# Patient Record
Sex: Male | Born: 1975
Health system: Southern US, Community
[De-identification: ages and names within clinical notes are randomized; demographics above are authoritative.]

## PROBLEM LIST (undated history)

## (undated) DIAGNOSIS — Q909 Down syndrome, unspecified: Secondary | ICD-10-CM

## (undated) DIAGNOSIS — I10 Essential (primary) hypertension: Secondary | ICD-10-CM

---

## 1999-05-19 ENCOUNTER — Emergency Department (HOSPITAL_COMMUNITY): Admission: EM | Admit: 1999-05-19 | Discharge: 1999-05-19 | Payer: Self-pay | Admitting: Emergency Medicine

## 1999-05-19 ENCOUNTER — Encounter: Payer: Self-pay | Admitting: Emergency Medicine

## 2004-07-10 ENCOUNTER — Ambulatory Visit (HOSPITAL_COMMUNITY): Admission: RE | Admit: 2004-07-10 | Discharge: 2004-07-10 | Payer: Self-pay | Admitting: Internal Medicine

## 2007-04-01 ENCOUNTER — Ambulatory Visit (HOSPITAL_COMMUNITY): Admission: RE | Admit: 2007-04-01 | Discharge: 2007-04-01 | Payer: Self-pay | Admitting: Internal Medicine

## 2009-02-02 ENCOUNTER — Ambulatory Visit: Payer: Self-pay | Admitting: Radiology

## 2009-02-02 ENCOUNTER — Emergency Department (HOSPITAL_BASED_OUTPATIENT_CLINIC_OR_DEPARTMENT_OTHER): Admission: EM | Admit: 2009-02-02 | Discharge: 2009-02-02 | Payer: Self-pay | Admitting: Emergency Medicine

## 2009-03-07 ENCOUNTER — Encounter: Admission: RE | Admit: 2009-03-07 | Discharge: 2009-03-07 | Payer: Self-pay | Admitting: Orthopedic Surgery

## 2009-04-03 ENCOUNTER — Encounter: Admission: RE | Admit: 2009-04-03 | Discharge: 2009-04-24 | Payer: Self-pay | Admitting: Orthopedic Surgery

## 2009-08-29 ENCOUNTER — Encounter: Admission: RE | Admit: 2009-08-29 | Discharge: 2009-08-29 | Payer: Self-pay | Admitting: Specialist

## 2010-06-03 ENCOUNTER — Emergency Department (HOSPITAL_BASED_OUTPATIENT_CLINIC_OR_DEPARTMENT_OTHER)
Admission: EM | Admit: 2010-06-03 | Discharge: 2010-06-03 | Payer: Self-pay | Source: Home / Self Care | Admitting: Emergency Medicine

## 2010-06-04 LAB — GLUCOSE, CAPILLARY: Glucose-Capillary: 150 mg/dL — ABNORMAL HIGH (ref 70–99)

## 2011-05-14 DIAGNOSIS — I10 Essential (primary) hypertension: Secondary | ICD-10-CM | POA: Diagnosis not present

## 2011-05-14 DIAGNOSIS — E119 Type 2 diabetes mellitus without complications: Secondary | ICD-10-CM | POA: Diagnosis not present

## 2011-05-14 DIAGNOSIS — F79 Unspecified intellectual disabilities: Secondary | ICD-10-CM | POA: Diagnosis not present

## 2011-06-12 DIAGNOSIS — Z79899 Other long term (current) drug therapy: Secondary | ICD-10-CM | POA: Diagnosis not present

## 2011-06-16 ENCOUNTER — Encounter (HOSPITAL_BASED_OUTPATIENT_CLINIC_OR_DEPARTMENT_OTHER): Payer: Self-pay | Admitting: Emergency Medicine

## 2011-06-16 ENCOUNTER — Emergency Department (HOSPITAL_BASED_OUTPATIENT_CLINIC_OR_DEPARTMENT_OTHER)
Admission: EM | Admit: 2011-06-16 | Discharge: 2011-06-17 | Disposition: A | Discharge status: Home / Self Care | Payer: Medicaid Other | Attending: Emergency Medicine | Admitting: Emergency Medicine

## 2011-06-16 DIAGNOSIS — Q909 Down syndrome, unspecified: Secondary | ICD-10-CM | POA: Insufficient documentation

## 2011-06-16 DIAGNOSIS — E1169 Type 2 diabetes mellitus with other specified complication: Secondary | ICD-10-CM | POA: Diagnosis not present

## 2011-06-16 DIAGNOSIS — E162 Hypoglycemia, unspecified: Secondary | ICD-10-CM

## 2011-06-16 DIAGNOSIS — I10 Essential (primary) hypertension: Secondary | ICD-10-CM | POA: Insufficient documentation

## 2011-06-16 HISTORY — DX: Essential (primary) hypertension: I10

## 2011-06-16 HISTORY — DX: Down syndrome, unspecified: Q90.9

## 2011-06-16 LAB — URINALYSIS, ROUTINE W REFLEX MICROSCOPIC
Bilirubin Urine: NEGATIVE
Leukocytes, UA: NEGATIVE
Nitrite: NEGATIVE
Specific Gravity, Urine: 1.024 (ref 1.005–1.030)
Urobilinogen, UA: 1 mg/dL (ref 0.0–1.0)
pH: 8 (ref 5.0–8.0)

## 2011-06-16 LAB — GLUCOSE, CAPILLARY: Glucose-Capillary: 90 mg/dL (ref 70–99)

## 2011-06-16 MED ORDER — ONDANSETRON 4 MG PO TBDP
4.0000 mg | ORAL_TABLET | Freq: Once | ORAL | Status: AC
  Administered 2011-06-16: 4 mg via ORAL
  Filled 2011-06-16: qty 1

## 2011-06-16 NOTE — ED Notes (Signed)
Pt has had low blood sugar tonight. Pt lives in a group home. Pt drank juice and ate peanut butter.

## 2011-06-16 NOTE — ED Provider Notes (Signed)
History     CSN: [id number]  Arrival date & time 06/16/11  2253   First MD Initiated Contact with Patient 06/16/11 2325      Chief Complaint  Patient presents with  . Hypoglycemia    (Consider location/radiation/quality/duration/timing/severity/associated sxs/prior treatment) HPI This is a 36 year old black male with history of Down syndrome. He also has a history of diabetes. His sugar was checked at 5 PM per protocol and found to be 48. Despite eating dinner it continued to be below 60 for several hours, as well as 43 at one time. He drank some juice and ate peanut butter but because of persistent hypoglycemia he was brought here. His blood sugar was noted to be 90 on arrival. He denies being symptomatic at any time, specifically denying nausea, vomiting, fever, chills or dysuria. All of having a diagnosis of Down syndrome on examination he appears to be very high functioning and able to give a reasonable history and review of systems.  Past Medical History  Diagnosis Date  . Diabetes mellitus   . Down syndrome   . Hypertension     History reviewed. No pertinent past surgical history.  No family history on file.  History  Substance Use Topics  . Smoking status: Never Smoker   . Smokeless tobacco: Not on file  . Alcohol Use: No      Review of Systems  All other systems reviewed and are negative.    Allergies  Review of patient's allergies indicates no known allergies.  Home Medications   Current Outpatient Rx  Name Route Sig Dispense Refill  . AMLODIPINE BESYLATE 10 MG PO TABS Oral Take 10 mg by mouth daily.    . ENALAPRIL MALEATE 20 MG PO TABS Oral Take 20 mg by mouth daily.    Marland Kitchen HYDROCHLOROTHIAZIDE 25 MG PO TABS Oral Take 25 mg by mouth daily.    . INSULIN LISPRO PROT & LISPRO (75-25) 100 UNIT/ML [place] SUSP Subcutaneous Inject 25 Units into the skin daily with supper.    Marland Kitchen METFORMIN HCL 500 MG PO TABS Oral Take 500 mg by mouth 2 (two) times daily with a meal.      . NALTREXONE HCL 50 MG PO TABS Oral Take 50 mg by mouth daily.    Marland Kitchen VITAMIN D (ERGOCALCIFEROL) 50000 UNITS PO CAPS Oral Take 50,000 Units by mouth every 30 (thirty) days. Take on the 14th      BP 150/87  Pulse 98  Temp(Src) 98.3 F (36.8 C) (Oral)  Resp 18  SpO2 95%  Physical Exam General: Well-developed, well-nourished male in no acute distress; appearance consistent with age of record HENT: Slightly dysmorphic facies but without the usual appearance of Down syndrome, atraumatic Eyes: pupils equal round and reactive to light; extraocular muscles intact; no Brushfield spots Neck: supple Heart: regular rate and rhythm; no murmurs Lungs: clear to auscultation bilaterally Abdomen: soft; nondistended Extremities: No deformity; full range of motion; pulses normal; mild clinodactyly; double palmar creases Neurologic: Awake, alert and oriented; motor function intact in all extremities and symmetric; no facial droop Skin: Warm and dry     ED Course  Procedures (including critical care time)     MDM   Nursing notes and vitals signs, including pulse oximetry, reviewed.  Summary of this visit's results, reviewed by myself:  Labs:  Results for orders placed during the hospital encounter of 06/16/11  GLUCOSE, CAPILLARY      Component Value Range   Glucose-Capillary 90  70 - 99 (mg/dL)  URINALYSIS, ROUTINE W REFLEX MICROSCOPIC      Component Value Range   Color, Urine YELLOW  YELLOW    APPearance CLEAR  CLEAR    Specific Gravity, Urine 1.024  1.005 - 1.030    pH 8.0  5.0 - 8.0    Glucose, UA NEGATIVE  NEGATIVE (mg/dL)   Hgb urine dipstick NEGATIVE  NEGATIVE    Bilirubin Urine NEGATIVE  NEGATIVE    Ketones, ur 40 (*) NEGATIVE (mg/dL)   Protein, ur NEGATIVE  NEGATIVE (mg/dL)   Urobilinogen, UA 1.0  0.0 - 1.0 (mg/dL)   Nitrite NEGATIVE  NEGATIVE    Leukocytes, UA NEGATIVE  NEGATIVE   GLUCOSE, CAPILLARY      Component Value Range   Glucose-Capillary 153 (*) 70 - 99 (mg/dL)              Hanley Seamen, MD 06/17/11 0100

## 2011-09-08 DIAGNOSIS — I10 Essential (primary) hypertension: Secondary | ICD-10-CM | POA: Diagnosis not present

## 2011-09-08 DIAGNOSIS — E119 Type 2 diabetes mellitus without complications: Secondary | ICD-10-CM | POA: Diagnosis not present

## 2011-09-08 DIAGNOSIS — F79 Unspecified intellectual disabilities: Secondary | ICD-10-CM | POA: Diagnosis not present

## 2011-11-09 DIAGNOSIS — F79 Unspecified intellectual disabilities: Secondary | ICD-10-CM | POA: Diagnosis not present

## 2011-11-09 DIAGNOSIS — E109 Type 1 diabetes mellitus without complications: Secondary | ICD-10-CM | POA: Diagnosis not present

## 2011-11-09 DIAGNOSIS — I10 Essential (primary) hypertension: Secondary | ICD-10-CM | POA: Diagnosis not present

## 2011-11-11 DIAGNOSIS — I10 Essential (primary) hypertension: Secondary | ICD-10-CM | POA: Diagnosis not present

## 2011-11-11 DIAGNOSIS — F79 Unspecified intellectual disabilities: Secondary | ICD-10-CM | POA: Diagnosis not present

## 2011-11-11 DIAGNOSIS — T148XXA Other injury of unspecified body region, initial encounter: Secondary | ICD-10-CM | POA: Diagnosis not present

## 2011-11-11 DIAGNOSIS — E109 Type 1 diabetes mellitus without complications: Secondary | ICD-10-CM | POA: Diagnosis not present

## 2011-11-16 DIAGNOSIS — B351 Tinea unguium: Secondary | ICD-10-CM | POA: Diagnosis not present

## 2011-11-17 DIAGNOSIS — I10 Essential (primary) hypertension: Secondary | ICD-10-CM | POA: Diagnosis not present

## 2011-11-17 DIAGNOSIS — E049 Nontoxic goiter, unspecified: Secondary | ICD-10-CM | POA: Diagnosis not present

## 2011-11-17 DIAGNOSIS — E559 Vitamin D deficiency, unspecified: Secondary | ICD-10-CM | POA: Diagnosis not present

## 2011-11-17 DIAGNOSIS — E1101 Type 2 diabetes mellitus with hyperosmolarity with coma: Secondary | ICD-10-CM | POA: Diagnosis not present

## 2011-11-17 DIAGNOSIS — R946 Abnormal results of thyroid function studies: Secondary | ICD-10-CM | POA: Diagnosis not present

## 2011-11-17 DIAGNOSIS — L03119 Cellulitis of unspecified part of limb: Secondary | ICD-10-CM | POA: Diagnosis not present

## 2011-12-01 ENCOUNTER — Other Ambulatory Visit: Payer: Self-pay | Admitting: Internal Medicine

## 2011-12-01 DIAGNOSIS — R7989 Other specified abnormal findings of blood chemistry: Secondary | ICD-10-CM

## 2011-12-02 DIAGNOSIS — E109 Type 1 diabetes mellitus without complications: Secondary | ICD-10-CM | POA: Diagnosis not present

## 2011-12-08 ENCOUNTER — Encounter (HOSPITAL_COMMUNITY)
Admission: RE | Admit: 2011-12-08 | Discharge: 2011-12-08 | Disposition: A | Discharge status: Home / Self Care | Payer: Medicare Other | Source: Ambulatory Visit | Attending: Internal Medicine | Admitting: Internal Medicine

## 2011-12-08 DIAGNOSIS — R7989 Other specified abnormal findings of blood chemistry: Secondary | ICD-10-CM

## 2011-12-08 DIAGNOSIS — E0590 Thyrotoxicosis, unspecified without thyrotoxic crisis or storm: Secondary | ICD-10-CM | POA: Diagnosis not present

## 2011-12-08 DIAGNOSIS — R946 Abnormal results of thyroid function studies: Secondary | ICD-10-CM | POA: Insufficient documentation

## 2011-12-08 MED ORDER — SODIUM IODIDE I 131 CAPSULE
7.0000 | Freq: Once | INTRAVENOUS | Status: AC | PRN
  Administered 2011-12-08: 7 via ORAL

## 2011-12-09 ENCOUNTER — Encounter (HOSPITAL_COMMUNITY)
Admission: RE | Admit: 2011-12-09 | Discharge: 2011-12-09 | Disposition: A | Discharge status: Home / Self Care | Payer: Medicare Other | Source: Ambulatory Visit | Attending: Internal Medicine | Admitting: Internal Medicine

## 2011-12-09 DIAGNOSIS — R946 Abnormal results of thyroid function studies: Secondary | ICD-10-CM | POA: Diagnosis not present

## 2011-12-09 MED ORDER — SODIUM IODIDE I 131 CAPSULE
7.0000 | Freq: Once | INTRAVENOUS | Status: AC | PRN
  Administered 2011-12-08: 7 via ORAL

## 2011-12-09 MED ORDER — SODIUM IODIDE I 131 CAPSULE
7.0000 | Freq: Once | INTRAVENOUS | Status: AC | PRN
  Administered 2011-12-09: 7 via ORAL

## 2011-12-09 MED ORDER — SODIUM PERTECHNETATE TC 99M INJECTION
10.0000 | Freq: Once | INTRAVENOUS | Status: AC | PRN
  Administered 2011-12-09: 10 via INTRAVENOUS

## 2011-12-16 DIAGNOSIS — R718 Other abnormality of red blood cells: Secondary | ICD-10-CM | POA: Diagnosis not present

## 2011-12-16 DIAGNOSIS — E119 Type 2 diabetes mellitus without complications: Secondary | ICD-10-CM | POA: Diagnosis not present

## 2011-12-16 DIAGNOSIS — I10 Essential (primary) hypertension: Secondary | ICD-10-CM | POA: Diagnosis not present

## 2011-12-16 DIAGNOSIS — F79 Unspecified intellectual disabilities: Secondary | ICD-10-CM | POA: Diagnosis not present

## 2011-12-23 DIAGNOSIS — E061 Subacute thyroiditis: Secondary | ICD-10-CM | POA: Diagnosis not present

## 2011-12-29 DIAGNOSIS — E119 Type 2 diabetes mellitus without complications: Secondary | ICD-10-CM | POA: Diagnosis not present

## 2011-12-29 DIAGNOSIS — E785 Hyperlipidemia, unspecified: Secondary | ICD-10-CM | POA: Diagnosis not present

## 2011-12-29 DIAGNOSIS — T887XXA Unspecified adverse effect of drug or medicament, initial encounter: Secondary | ICD-10-CM | POA: Diagnosis not present

## 2012-02-01 IMAGING — CR DG CHEST 2V
2 series · 2 of 2 positions shown · non-contrast
Comparison: None.

CLINICAL DATA: 34-year-old with cough and fever.

CHEST - 2 VIEW

[w chest pa]
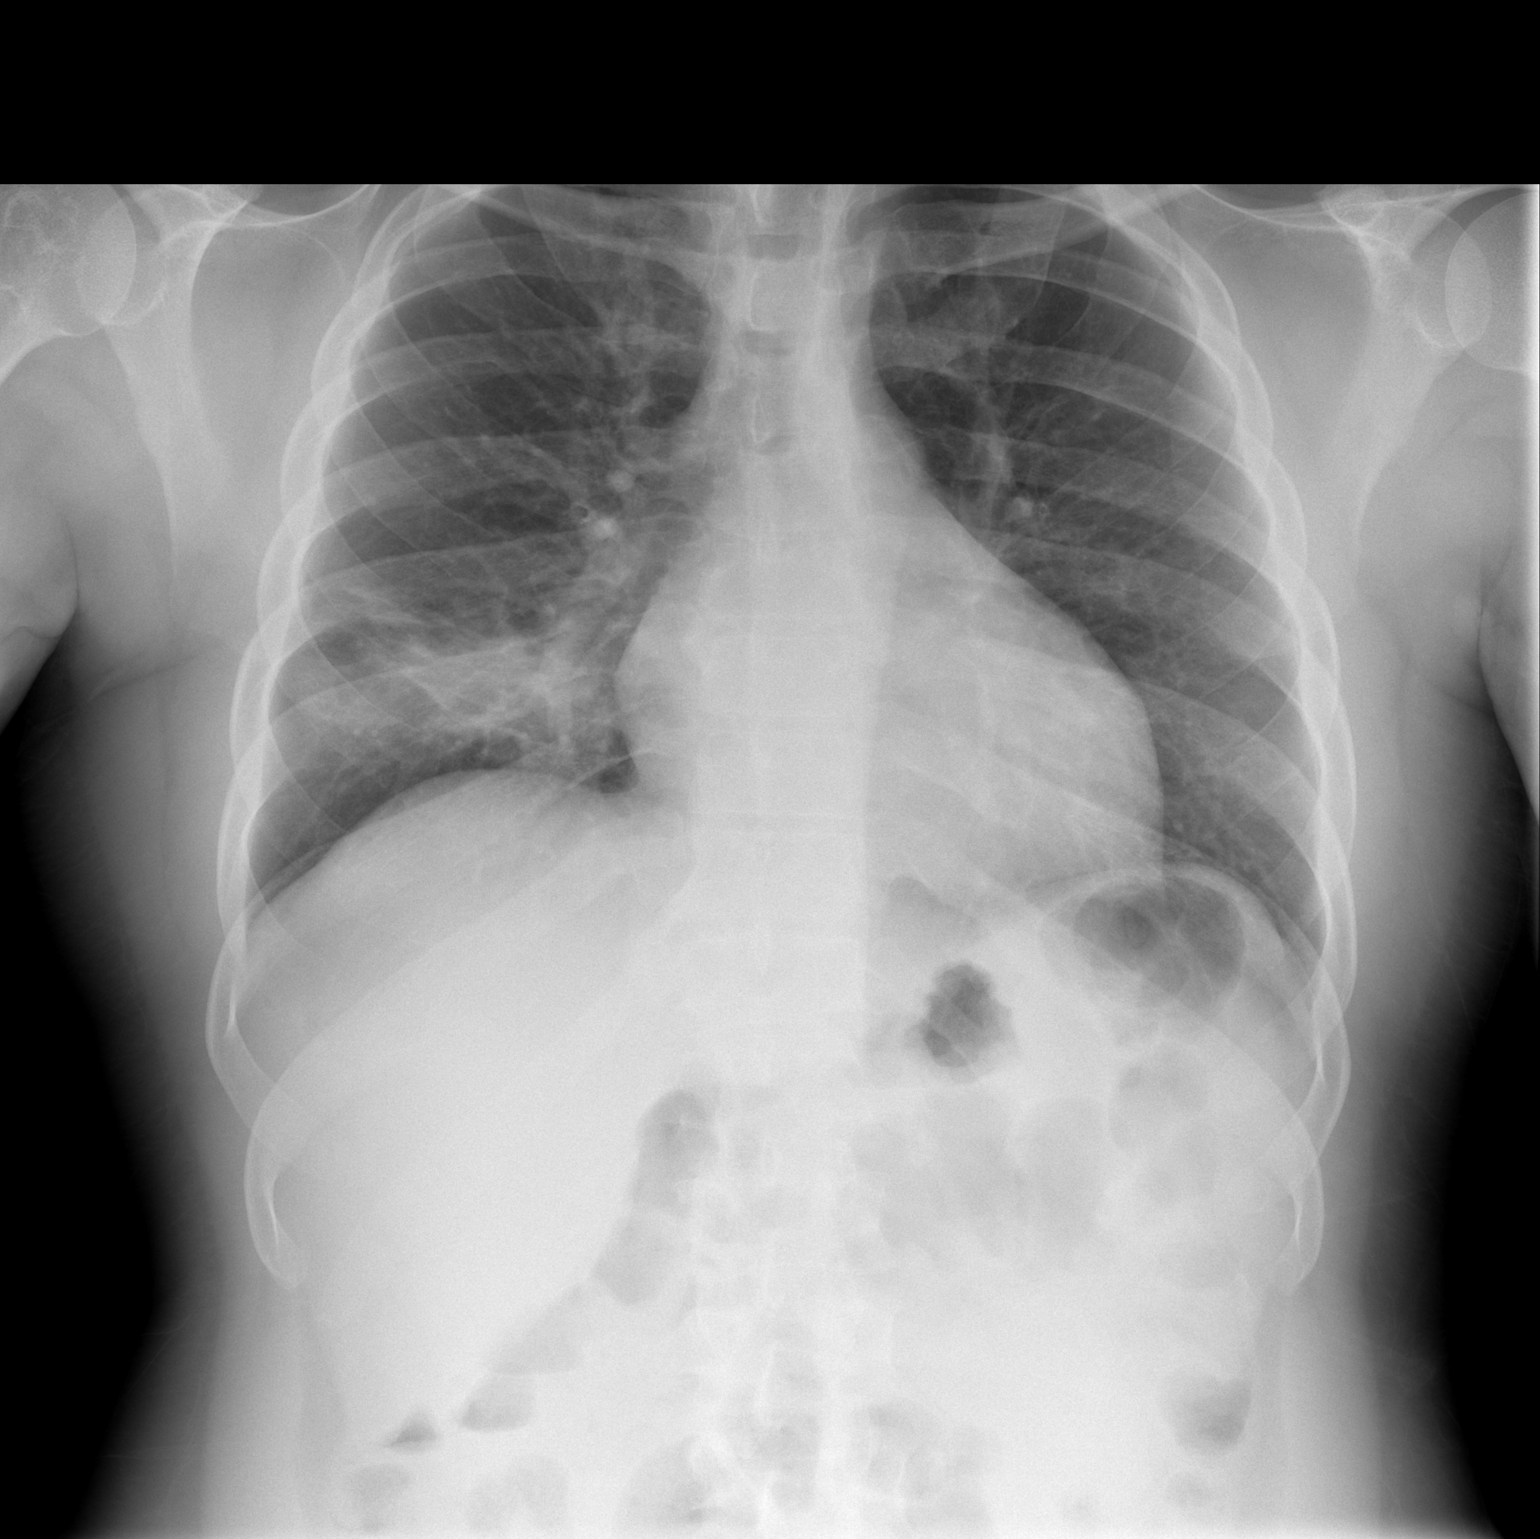

[w chest lat]
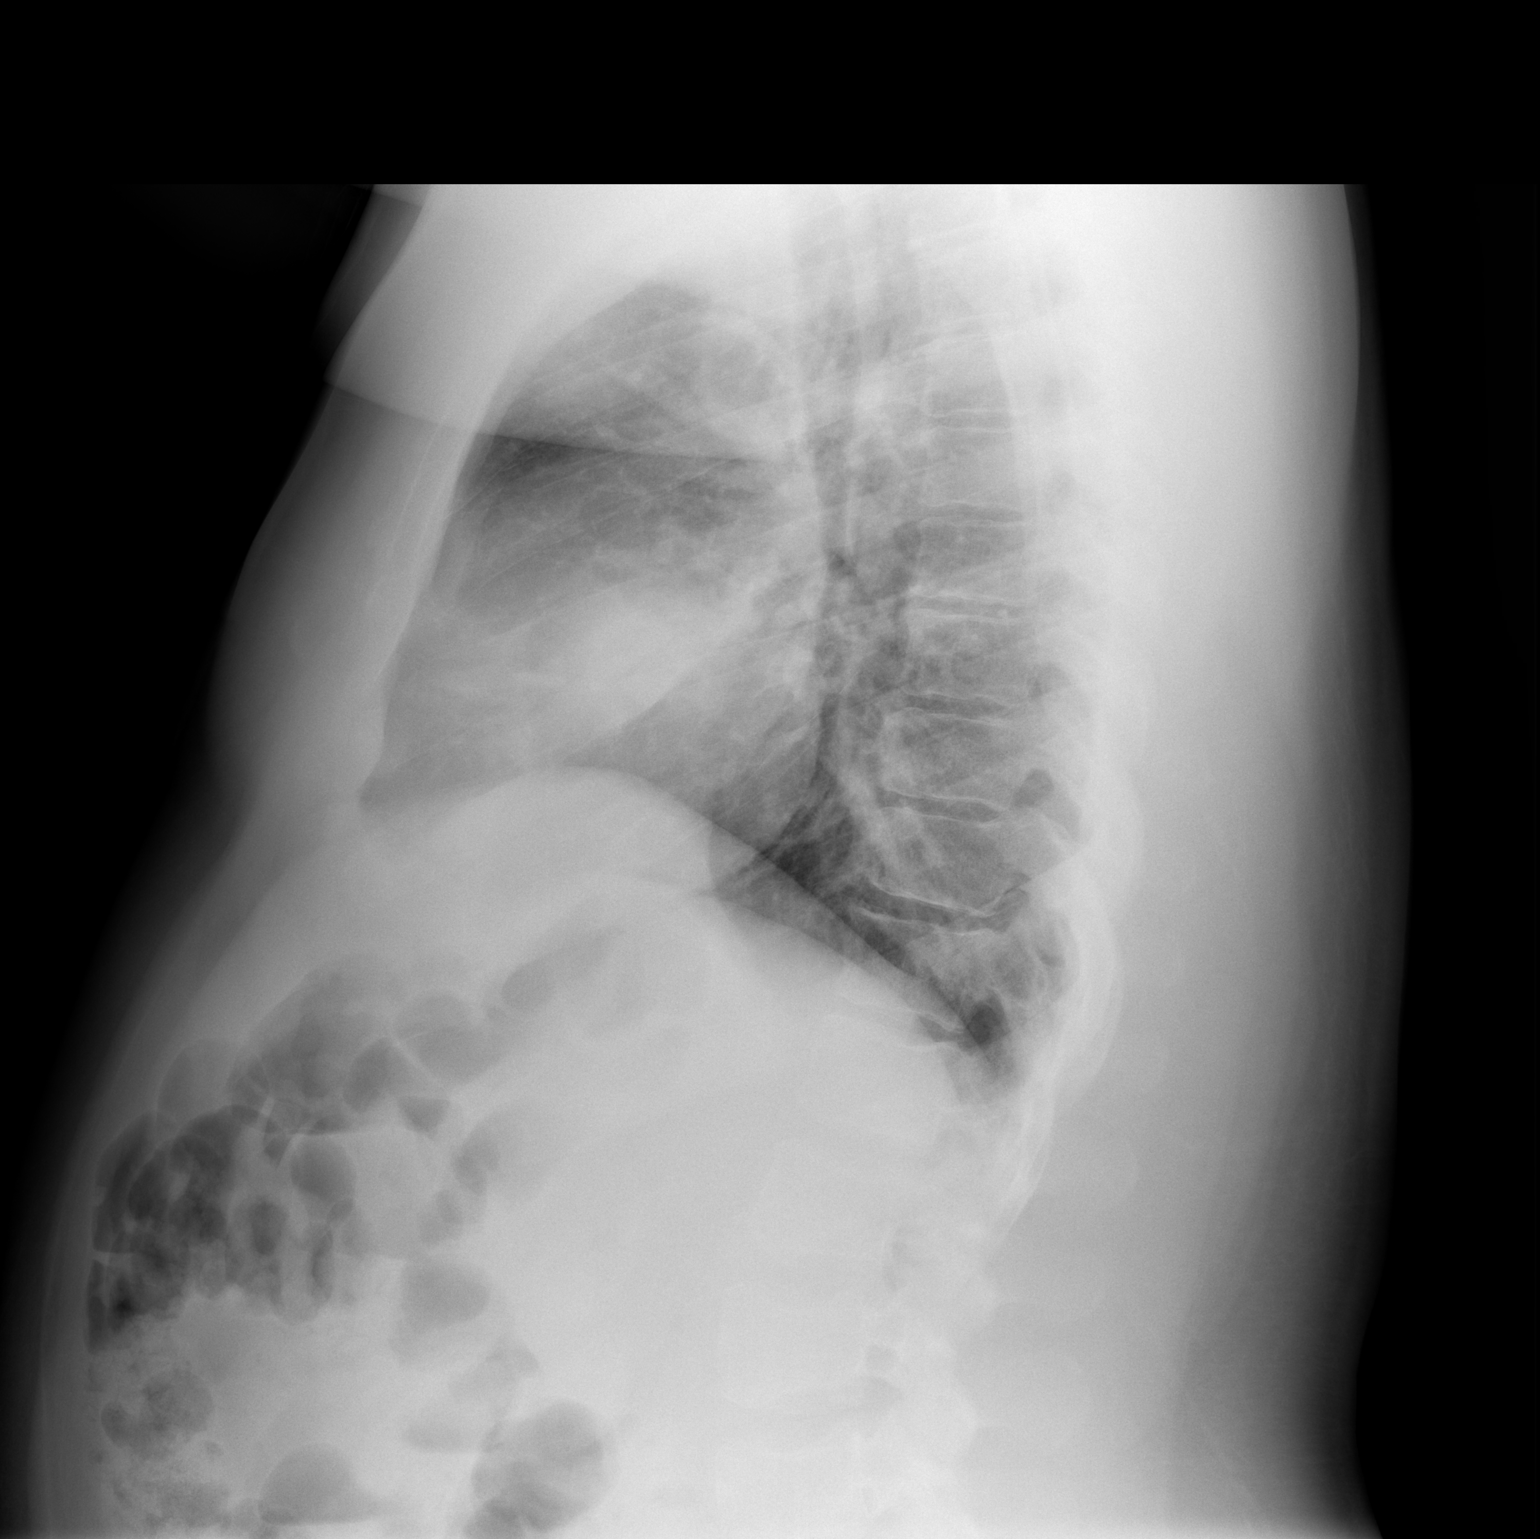

[2 of 2 positions shown; findings below may reference images not displayed]

FINDINGS: Two views of the chest demonstrate opacification in the
region of the right middle lobe.  Findings are most compatible with
infection.  Remainder of the lungs appear clear.  Heart and
mediastinum are within normal limits and the trachea is midline.
Osseous structures are intact.
IMPRESSION: Right middle lobe pneumonia.

Report was called to Dr. Nasuhi on 06/03/2010 at [DATE] p.m.

## 2012-02-09 DIAGNOSIS — I10 Essential (primary) hypertension: Secondary | ICD-10-CM | POA: Diagnosis not present

## 2012-02-09 DIAGNOSIS — E061 Subacute thyroiditis: Secondary | ICD-10-CM | POA: Diagnosis not present

## 2012-02-09 DIAGNOSIS — E7800 Pure hypercholesterolemia, unspecified: Secondary | ICD-10-CM | POA: Diagnosis not present

## 2012-03-02 DIAGNOSIS — F489 Nonpsychotic mental disorder, unspecified: Secondary | ICD-10-CM | POA: Diagnosis not present

## 2012-03-02 DIAGNOSIS — E119 Type 2 diabetes mellitus without complications: Secondary | ICD-10-CM | POA: Diagnosis not present

## 2012-03-02 DIAGNOSIS — F4325 Adjustment disorder with mixed disturbance of emotions and conduct: Secondary | ICD-10-CM | POA: Diagnosis not present

## 2012-03-02 DIAGNOSIS — I10 Essential (primary) hypertension: Secondary | ICD-10-CM | POA: Diagnosis not present

## 2012-03-25 DIAGNOSIS — T887XXA Unspecified adverse effect of drug or medicament, initial encounter: Secondary | ICD-10-CM | POA: Diagnosis not present

## 2012-03-25 DIAGNOSIS — E618 Deficiency of other specified nutrient elements: Secondary | ICD-10-CM | POA: Diagnosis not present

## 2012-04-04 DIAGNOSIS — Z23 Encounter for immunization: Secondary | ICD-10-CM | POA: Diagnosis not present

## 2012-04-12 DIAGNOSIS — B351 Tinea unguium: Secondary | ICD-10-CM | POA: Diagnosis not present

## 2012-04-22 DIAGNOSIS — Z79899 Other long term (current) drug therapy: Secondary | ICD-10-CM | POA: Diagnosis not present

## 2012-04-22 DIAGNOSIS — F79 Unspecified intellectual disabilities: Secondary | ICD-10-CM | POA: Diagnosis not present

## 2012-05-24 DIAGNOSIS — I10 Essential (primary) hypertension: Secondary | ICD-10-CM | POA: Diagnosis not present

## 2012-05-24 DIAGNOSIS — E061 Subacute thyroiditis: Secondary | ICD-10-CM | POA: Diagnosis not present

## 2012-06-16 DIAGNOSIS — E559 Vitamin D deficiency, unspecified: Secondary | ICD-10-CM | POA: Diagnosis not present

## 2012-06-16 DIAGNOSIS — E785 Hyperlipidemia, unspecified: Secondary | ICD-10-CM | POA: Diagnosis not present

## 2012-06-16 DIAGNOSIS — T887XXA Unspecified adverse effect of drug or medicament, initial encounter: Secondary | ICD-10-CM | POA: Diagnosis not present

## 2012-06-16 DIAGNOSIS — E119 Type 2 diabetes mellitus without complications: Secondary | ICD-10-CM | POA: Diagnosis not present

## 2012-07-05 DIAGNOSIS — I10 Essential (primary) hypertension: Secondary | ICD-10-CM | POA: Diagnosis not present

## 2012-08-19 DIAGNOSIS — E785 Hyperlipidemia, unspecified: Secondary | ICD-10-CM | POA: Diagnosis not present

## 2012-08-19 DIAGNOSIS — T887XXA Unspecified adverse effect of drug or medicament, initial encounter: Secondary | ICD-10-CM | POA: Diagnosis not present

## 2012-09-13 DIAGNOSIS — Z79899 Other long term (current) drug therapy: Secondary | ICD-10-CM | POA: Diagnosis not present

## 2012-09-13 DIAGNOSIS — F79 Unspecified intellectual disabilities: Secondary | ICD-10-CM | POA: Diagnosis not present

## 2012-09-22 DIAGNOSIS — T887XXA Unspecified adverse effect of drug or medicament, initial encounter: Secondary | ICD-10-CM | POA: Diagnosis not present

## 2012-11-01 DIAGNOSIS — I10 Essential (primary) hypertension: Secondary | ICD-10-CM | POA: Diagnosis not present

## 2012-11-01 DIAGNOSIS — E782 Mixed hyperlipidemia: Secondary | ICD-10-CM | POA: Diagnosis not present

## 2012-11-02 DIAGNOSIS — F79 Unspecified intellectual disabilities: Secondary | ICD-10-CM | POA: Diagnosis not present

## 2012-11-02 DIAGNOSIS — E119 Type 2 diabetes mellitus without complications: Secondary | ICD-10-CM | POA: Diagnosis not present

## 2012-11-02 DIAGNOSIS — E663 Overweight: Secondary | ICD-10-CM | POA: Diagnosis not present

## 2012-11-23 DIAGNOSIS — R718 Other abnormality of red blood cells: Secondary | ICD-10-CM | POA: Diagnosis not present

## 2012-11-23 DIAGNOSIS — E119 Type 2 diabetes mellitus without complications: Secondary | ICD-10-CM | POA: Diagnosis not present

## 2012-11-23 DIAGNOSIS — F79 Unspecified intellectual disabilities: Secondary | ICD-10-CM | POA: Diagnosis not present

## 2012-12-07 DIAGNOSIS — E119 Type 2 diabetes mellitus without complications: Secondary | ICD-10-CM | POA: Diagnosis not present

## 2012-12-07 DIAGNOSIS — R635 Abnormal weight gain: Secondary | ICD-10-CM | POA: Diagnosis not present

## 2012-12-07 DIAGNOSIS — F79 Unspecified intellectual disabilities: Secondary | ICD-10-CM | POA: Diagnosis not present

## 2012-12-12 DIAGNOSIS — E785 Hyperlipidemia, unspecified: Secondary | ICD-10-CM | POA: Diagnosis not present

## 2012-12-12 DIAGNOSIS — T887XXA Unspecified adverse effect of drug or medicament, initial encounter: Secondary | ICD-10-CM | POA: Diagnosis not present

## 2012-12-12 DIAGNOSIS — Z7901 Long term (current) use of anticoagulants: Secondary | ICD-10-CM | POA: Diagnosis not present

## 2012-12-12 DIAGNOSIS — E559 Vitamin D deficiency, unspecified: Secondary | ICD-10-CM | POA: Diagnosis not present

## 2012-12-12 DIAGNOSIS — E119 Type 2 diabetes mellitus without complications: Secondary | ICD-10-CM | POA: Diagnosis not present

## 2012-12-28 DIAGNOSIS — E119 Type 2 diabetes mellitus without complications: Secondary | ICD-10-CM | POA: Diagnosis not present

## 2012-12-28 DIAGNOSIS — F79 Unspecified intellectual disabilities: Secondary | ICD-10-CM | POA: Diagnosis not present

## 2012-12-28 DIAGNOSIS — R718 Other abnormality of red blood cells: Secondary | ICD-10-CM | POA: Diagnosis not present

## 2013-01-11 DIAGNOSIS — F79 Unspecified intellectual disabilities: Secondary | ICD-10-CM | POA: Diagnosis not present

## 2013-01-11 DIAGNOSIS — E119 Type 2 diabetes mellitus without complications: Secondary | ICD-10-CM | POA: Diagnosis not present

## 2013-01-11 DIAGNOSIS — I10 Essential (primary) hypertension: Secondary | ICD-10-CM | POA: Diagnosis not present

## 2013-01-18 DIAGNOSIS — E1065 Type 1 diabetes mellitus with hyperglycemia: Secondary | ICD-10-CM | POA: Diagnosis not present

## 2013-02-15 DIAGNOSIS — F79 Unspecified intellectual disabilities: Secondary | ICD-10-CM | POA: Diagnosis not present

## 2013-02-15 DIAGNOSIS — E119 Type 2 diabetes mellitus without complications: Secondary | ICD-10-CM | POA: Diagnosis not present

## 2013-02-15 DIAGNOSIS — H6120 Impacted cerumen, unspecified ear: Secondary | ICD-10-CM | POA: Diagnosis not present

## 2013-02-15 DIAGNOSIS — I10 Essential (primary) hypertension: Secondary | ICD-10-CM | POA: Diagnosis not present

## 2013-02-21 DIAGNOSIS — H6120 Impacted cerumen, unspecified ear: Secondary | ICD-10-CM | POA: Diagnosis not present

## 2013-02-21 DIAGNOSIS — F79 Unspecified intellectual disabilities: Secondary | ICD-10-CM | POA: Diagnosis not present

## 2013-02-21 DIAGNOSIS — K5900 Constipation, unspecified: Secondary | ICD-10-CM | POA: Diagnosis not present

## 2013-03-06 DIAGNOSIS — I10 Essential (primary) hypertension: Secondary | ICD-10-CM | POA: Diagnosis not present

## 2013-03-06 DIAGNOSIS — M2550 Pain in unspecified joint: Secondary | ICD-10-CM | POA: Diagnosis not present

## 2013-03-06 DIAGNOSIS — E7800 Pure hypercholesterolemia, unspecified: Secondary | ICD-10-CM | POA: Diagnosis not present

## 2013-03-06 DIAGNOSIS — E669 Obesity, unspecified: Secondary | ICD-10-CM | POA: Diagnosis not present

## 2013-03-08 DIAGNOSIS — E669 Obesity, unspecified: Secondary | ICD-10-CM | POA: Diagnosis not present

## 2013-03-08 DIAGNOSIS — E119 Type 2 diabetes mellitus without complications: Secondary | ICD-10-CM | POA: Diagnosis not present

## 2013-03-08 DIAGNOSIS — F79 Unspecified intellectual disabilities: Secondary | ICD-10-CM | POA: Diagnosis not present

## 2013-03-08 DIAGNOSIS — E785 Hyperlipidemia, unspecified: Secondary | ICD-10-CM | POA: Diagnosis not present

## 2013-03-09 DIAGNOSIS — Z79899 Other long term (current) drug therapy: Secondary | ICD-10-CM | POA: Diagnosis not present

## 2013-03-09 DIAGNOSIS — I10 Essential (primary) hypertension: Secondary | ICD-10-CM | POA: Diagnosis not present

## 2013-03-09 DIAGNOSIS — F79 Unspecified intellectual disabilities: Secondary | ICD-10-CM | POA: Diagnosis not present

## 2013-03-15 DIAGNOSIS — E119 Type 2 diabetes mellitus without complications: Secondary | ICD-10-CM | POA: Diagnosis not present

## 2013-03-15 DIAGNOSIS — I10 Essential (primary) hypertension: Secondary | ICD-10-CM | POA: Diagnosis not present

## 2013-03-15 DIAGNOSIS — F79 Unspecified intellectual disabilities: Secondary | ICD-10-CM | POA: Diagnosis not present

## 2013-04-04 DIAGNOSIS — E119 Type 2 diabetes mellitus without complications: Secondary | ICD-10-CM | POA: Diagnosis not present

## 2013-04-04 DIAGNOSIS — E249 Cushing's syndrome, unspecified: Secondary | ICD-10-CM | POA: Diagnosis not present

## 2013-04-04 DIAGNOSIS — T887XXA Unspecified adverse effect of drug or medicament, initial encounter: Secondary | ICD-10-CM | POA: Diagnosis not present

## 2013-04-12 DIAGNOSIS — F79 Unspecified intellectual disabilities: Secondary | ICD-10-CM | POA: Diagnosis not present

## 2013-04-12 DIAGNOSIS — E119 Type 2 diabetes mellitus without complications: Secondary | ICD-10-CM | POA: Diagnosis not present

## 2013-04-13 DIAGNOSIS — Z23 Encounter for immunization: Secondary | ICD-10-CM | POA: Diagnosis not present

## 2013-05-17 DIAGNOSIS — F79 Unspecified intellectual disabilities: Secondary | ICD-10-CM | POA: Diagnosis not present

## 2013-05-17 DIAGNOSIS — E119 Type 2 diabetes mellitus without complications: Secondary | ICD-10-CM | POA: Diagnosis not present

## 2013-05-17 DIAGNOSIS — H6120 Impacted cerumen, unspecified ear: Secondary | ICD-10-CM | POA: Diagnosis not present

## 2013-05-30 DIAGNOSIS — R1012 Left upper quadrant pain: Secondary | ICD-10-CM | POA: Diagnosis not present

## 2013-05-30 DIAGNOSIS — I10 Essential (primary) hypertension: Secondary | ICD-10-CM | POA: Diagnosis not present

## 2013-05-30 DIAGNOSIS — IMO0001 Reserved for inherently not codable concepts without codable children: Secondary | ICD-10-CM | POA: Diagnosis not present

## 2013-05-30 DIAGNOSIS — E7800 Pure hypercholesterolemia, unspecified: Secondary | ICD-10-CM | POA: Diagnosis not present

## 2013-05-31 DIAGNOSIS — E119 Type 2 diabetes mellitus without complications: Secondary | ICD-10-CM | POA: Diagnosis not present

## 2013-05-31 DIAGNOSIS — G890 Central pain syndrome: Secondary | ICD-10-CM | POA: Diagnosis not present

## 2013-05-31 DIAGNOSIS — F79 Unspecified intellectual disabilities: Secondary | ICD-10-CM | POA: Diagnosis not present

## 2013-06-07 DIAGNOSIS — H6120 Impacted cerumen, unspecified ear: Secondary | ICD-10-CM | POA: Diagnosis not present

## 2013-06-07 DIAGNOSIS — F79 Unspecified intellectual disabilities: Secondary | ICD-10-CM | POA: Diagnosis not present

## 2013-08-23 DIAGNOSIS — F79 Unspecified intellectual disabilities: Secondary | ICD-10-CM | POA: Diagnosis not present

## 2013-08-23 DIAGNOSIS — E119 Type 2 diabetes mellitus without complications: Secondary | ICD-10-CM | POA: Diagnosis not present

## 2013-08-23 DIAGNOSIS — I10 Essential (primary) hypertension: Secondary | ICD-10-CM | POA: Diagnosis not present

## 2013-08-31 DIAGNOSIS — Z79899 Other long term (current) drug therapy: Secondary | ICD-10-CM | POA: Diagnosis not present

## 2013-08-31 DIAGNOSIS — F79 Unspecified intellectual disabilities: Secondary | ICD-10-CM | POA: Diagnosis not present

## 2013-09-08 DIAGNOSIS — E119 Type 2 diabetes mellitus without complications: Secondary | ICD-10-CM | POA: Diagnosis not present

## 2013-09-08 DIAGNOSIS — T887XXA Unspecified adverse effect of drug or medicament, initial encounter: Secondary | ICD-10-CM | POA: Diagnosis not present

## 2013-09-13 DIAGNOSIS — E119 Type 2 diabetes mellitus without complications: Secondary | ICD-10-CM | POA: Diagnosis not present

## 2013-09-13 DIAGNOSIS — IMO0001 Reserved for inherently not codable concepts without codable children: Secondary | ICD-10-CM | POA: Diagnosis not present

## 2013-09-13 DIAGNOSIS — I10 Essential (primary) hypertension: Secondary | ICD-10-CM | POA: Diagnosis not present

## 2013-09-13 DIAGNOSIS — F79 Unspecified intellectual disabilities: Secondary | ICD-10-CM | POA: Diagnosis not present

## 2013-09-13 DIAGNOSIS — E7800 Pure hypercholesterolemia, unspecified: Secondary | ICD-10-CM | POA: Diagnosis not present

## 2013-09-20 DIAGNOSIS — F79 Unspecified intellectual disabilities: Secondary | ICD-10-CM | POA: Diagnosis not present

## 2013-09-20 DIAGNOSIS — I10 Essential (primary) hypertension: Secondary | ICD-10-CM | POA: Diagnosis not present

## 2013-09-20 DIAGNOSIS — E785 Hyperlipidemia, unspecified: Secondary | ICD-10-CM | POA: Diagnosis not present

## 2013-09-26 DIAGNOSIS — J309 Allergic rhinitis, unspecified: Secondary | ICD-10-CM | POA: Diagnosis not present

## 2013-09-26 DIAGNOSIS — I10 Essential (primary) hypertension: Secondary | ICD-10-CM | POA: Diagnosis not present

## 2013-09-26 DIAGNOSIS — F79 Unspecified intellectual disabilities: Secondary | ICD-10-CM | POA: Diagnosis not present

## 2013-12-11 DIAGNOSIS — E785 Hyperlipidemia, unspecified: Secondary | ICD-10-CM | POA: Diagnosis not present

## 2013-12-11 DIAGNOSIS — R945 Abnormal results of liver function studies: Secondary | ICD-10-CM | POA: Diagnosis not present

## 2013-12-11 DIAGNOSIS — E119 Type 2 diabetes mellitus without complications: Secondary | ICD-10-CM | POA: Diagnosis not present

## 2013-12-11 DIAGNOSIS — T887XXA Unspecified adverse effect of drug or medicament, initial encounter: Secondary | ICD-10-CM | POA: Diagnosis not present

## 2013-12-13 DIAGNOSIS — E785 Hyperlipidemia, unspecified: Secondary | ICD-10-CM | POA: Diagnosis not present

## 2013-12-13 DIAGNOSIS — F79 Unspecified intellectual disabilities: Secondary | ICD-10-CM | POA: Diagnosis not present

## 2013-12-13 DIAGNOSIS — IMO0001 Reserved for inherently not codable concepts without codable children: Secondary | ICD-10-CM | POA: Diagnosis not present

## 2014-02-08 DIAGNOSIS — Z79899 Other long term (current) drug therapy: Secondary | ICD-10-CM | POA: Diagnosis not present

## 2014-02-08 DIAGNOSIS — E11311 Type 2 diabetes mellitus with unspecified diabetic retinopathy with macular edema: Secondary | ICD-10-CM | POA: Diagnosis not present

## 2014-02-08 DIAGNOSIS — F78 Other intellectual disabilities: Secondary | ICD-10-CM | POA: Diagnosis not present

## 2014-02-14 ENCOUNTER — Other Ambulatory Visit: Payer: Self-pay | Admitting: Internal Medicine

## 2014-02-14 DIAGNOSIS — E780 Pure hypercholesterolemia: Secondary | ICD-10-CM | POA: Diagnosis not present

## 2014-02-14 DIAGNOSIS — E119 Type 2 diabetes mellitus without complications: Secondary | ICD-10-CM | POA: Diagnosis not present

## 2014-02-14 DIAGNOSIS — I10 Essential (primary) hypertension: Secondary | ICD-10-CM | POA: Diagnosis not present

## 2014-02-14 DIAGNOSIS — E785 Hyperlipidemia, unspecified: Secondary | ICD-10-CM | POA: Diagnosis not present

## 2014-02-14 DIAGNOSIS — F78 Other intellectual disabilities: Secondary | ICD-10-CM | POA: Diagnosis not present

## 2014-02-14 DIAGNOSIS — R1012 Left upper quadrant pain: Secondary | ICD-10-CM | POA: Diagnosis not present

## 2014-02-14 DIAGNOSIS — R1011 Right upper quadrant pain: Secondary | ICD-10-CM

## 2014-02-20 DIAGNOSIS — R109 Unspecified abdominal pain: Secondary | ICD-10-CM | POA: Diagnosis not present

## 2014-02-20 DIAGNOSIS — R1031 Right lower quadrant pain: Secondary | ICD-10-CM | POA: Diagnosis not present

## 2014-02-20 DIAGNOSIS — R079 Chest pain, unspecified: Secondary | ICD-10-CM | POA: Diagnosis not present

## 2014-02-20 DIAGNOSIS — R1011 Right upper quadrant pain: Secondary | ICD-10-CM | POA: Diagnosis not present

## 2014-02-21 DIAGNOSIS — F78 Other intellectual disabilities: Secondary | ICD-10-CM | POA: Diagnosis not present

## 2014-02-21 DIAGNOSIS — K5900 Constipation, unspecified: Secondary | ICD-10-CM | POA: Diagnosis not present

## 2014-02-21 DIAGNOSIS — I10 Essential (primary) hypertension: Secondary | ICD-10-CM | POA: Diagnosis not present

## 2014-02-21 DIAGNOSIS — R1011 Right upper quadrant pain: Secondary | ICD-10-CM | POA: Diagnosis not present

## 2014-03-07 DIAGNOSIS — R739 Hyperglycemia, unspecified: Secondary | ICD-10-CM | POA: Diagnosis not present

## 2014-03-07 DIAGNOSIS — F79 Unspecified intellectual disabilities: Secondary | ICD-10-CM | POA: Diagnosis not present

## 2014-03-07 DIAGNOSIS — E119 Type 2 diabetes mellitus without complications: Secondary | ICD-10-CM | POA: Diagnosis not present

## 2014-03-20 DIAGNOSIS — I10 Essential (primary) hypertension: Secondary | ICD-10-CM | POA: Diagnosis not present

## 2014-03-20 DIAGNOSIS — E1165 Type 2 diabetes mellitus with hyperglycemia: Secondary | ICD-10-CM | POA: Diagnosis not present

## 2014-03-21 DIAGNOSIS — I10 Essential (primary) hypertension: Secondary | ICD-10-CM | POA: Diagnosis not present

## 2014-03-21 DIAGNOSIS — E119 Type 2 diabetes mellitus without complications: Secondary | ICD-10-CM | POA: Diagnosis not present

## 2014-03-21 DIAGNOSIS — F79 Unspecified intellectual disabilities: Secondary | ICD-10-CM | POA: Diagnosis not present

## 2014-05-02 DIAGNOSIS — D2372 Other benign neoplasm of skin of left lower limb, including hip: Secondary | ICD-10-CM | POA: Diagnosis not present

## 2014-05-09 DIAGNOSIS — I781 Nevus, non-neoplastic: Secondary | ICD-10-CM | POA: Diagnosis not present

## 2014-05-09 DIAGNOSIS — F79 Unspecified intellectual disabilities: Secondary | ICD-10-CM | POA: Diagnosis not present

## 2014-05-23 DIAGNOSIS — D2372 Other benign neoplasm of skin of left lower limb, including hip: Secondary | ICD-10-CM | POA: Diagnosis not present

## 2014-05-23 DIAGNOSIS — D485 Neoplasm of uncertain behavior of skin: Secondary | ICD-10-CM | POA: Diagnosis not present

## 2014-05-30 DIAGNOSIS — F79 Unspecified intellectual disabilities: Secondary | ICD-10-CM | POA: Diagnosis not present

## 2014-05-30 DIAGNOSIS — E119 Type 2 diabetes mellitus without complications: Secondary | ICD-10-CM | POA: Diagnosis not present

## 2014-05-30 DIAGNOSIS — S81802D Unspecified open wound, left lower leg, subsequent encounter: Secondary | ICD-10-CM | POA: Diagnosis not present

## 2014-06-13 DIAGNOSIS — E119 Type 2 diabetes mellitus without complications: Secondary | ICD-10-CM | POA: Diagnosis not present

## 2014-06-13 DIAGNOSIS — S71102D Unspecified open wound, left thigh, subsequent encounter: Secondary | ICD-10-CM | POA: Diagnosis not present

## 2014-06-13 DIAGNOSIS — F79 Unspecified intellectual disabilities: Secondary | ICD-10-CM | POA: Diagnosis not present

## 2014-07-04 DIAGNOSIS — F79 Unspecified intellectual disabilities: Secondary | ICD-10-CM | POA: Diagnosis not present

## 2014-07-04 DIAGNOSIS — K5900 Constipation, unspecified: Secondary | ICD-10-CM | POA: Diagnosis not present

## 2014-07-04 DIAGNOSIS — E119 Type 2 diabetes mellitus without complications: Secondary | ICD-10-CM | POA: Diagnosis not present

## 2014-07-18 DIAGNOSIS — K521 Toxic gastroenteritis and colitis: Secondary | ICD-10-CM | POA: Diagnosis not present

## 2014-07-18 DIAGNOSIS — F79 Unspecified intellectual disabilities: Secondary | ICD-10-CM | POA: Diagnosis not present

## 2014-08-01 DIAGNOSIS — E119 Type 2 diabetes mellitus without complications: Secondary | ICD-10-CM | POA: Diagnosis not present

## 2014-08-01 DIAGNOSIS — I10 Essential (primary) hypertension: Secondary | ICD-10-CM | POA: Diagnosis not present

## 2014-08-01 DIAGNOSIS — E780 Pure hypercholesterolemia: Secondary | ICD-10-CM | POA: Diagnosis not present

## 2014-08-02 DIAGNOSIS — E119 Type 2 diabetes mellitus without complications: Secondary | ICD-10-CM | POA: Diagnosis not present

## 2014-08-02 DIAGNOSIS — Z79899 Other long term (current) drug therapy: Secondary | ICD-10-CM | POA: Diagnosis not present

## 2014-08-02 DIAGNOSIS — F79 Unspecified intellectual disabilities: Secondary | ICD-10-CM | POA: Diagnosis not present

## 2014-08-08 DIAGNOSIS — M4186 Other forms of scoliosis, lumbar region: Secondary | ICD-10-CM | POA: Diagnosis not present

## 2014-08-08 DIAGNOSIS — L309 Dermatitis, unspecified: Secondary | ICD-10-CM | POA: Diagnosis not present

## 2014-08-08 DIAGNOSIS — M2578 Osteophyte, vertebrae: Secondary | ICD-10-CM | POA: Diagnosis not present

## 2014-08-08 DIAGNOSIS — M545 Low back pain: Secondary | ICD-10-CM | POA: Diagnosis not present

## 2014-08-08 DIAGNOSIS — M546 Pain in thoracic spine: Secondary | ICD-10-CM | POA: Diagnosis not present

## 2014-08-08 DIAGNOSIS — M47814 Spondylosis without myelopathy or radiculopathy, thoracic region: Secondary | ICD-10-CM | POA: Diagnosis not present

## 2014-08-08 DIAGNOSIS — E039 Hypothyroidism, unspecified: Secondary | ICD-10-CM | POA: Diagnosis not present

## 2014-08-08 DIAGNOSIS — F79 Unspecified intellectual disabilities: Secondary | ICD-10-CM | POA: Diagnosis not present

## 2014-08-15 DIAGNOSIS — M546 Pain in thoracic spine: Secondary | ICD-10-CM | POA: Diagnosis not present

## 2014-08-15 DIAGNOSIS — F79 Unspecified intellectual disabilities: Secondary | ICD-10-CM | POA: Diagnosis not present

## 2014-10-18 DIAGNOSIS — E559 Vitamin D deficiency, unspecified: Secondary | ICD-10-CM | POA: Diagnosis not present

## 2014-10-18 DIAGNOSIS — Z79899 Other long term (current) drug therapy: Secondary | ICD-10-CM | POA: Diagnosis not present

## 2014-10-19 DIAGNOSIS — Z79899 Other long term (current) drug therapy: Secondary | ICD-10-CM | POA: Diagnosis not present

## 2014-12-05 DIAGNOSIS — E780 Pure hypercholesterolemia: Secondary | ICD-10-CM | POA: Diagnosis not present

## 2014-12-05 DIAGNOSIS — E119 Type 2 diabetes mellitus without complications: Secondary | ICD-10-CM | POA: Diagnosis not present

## 2014-12-05 DIAGNOSIS — E782 Mixed hyperlipidemia: Secondary | ICD-10-CM | POA: Diagnosis not present

## 2014-12-05 DIAGNOSIS — I10 Essential (primary) hypertension: Secondary | ICD-10-CM | POA: Diagnosis not present

## 2014-12-05 DIAGNOSIS — F70 Mild intellectual disabilities: Secondary | ICD-10-CM | POA: Diagnosis not present

## 2014-12-05 DIAGNOSIS — Q909 Down syndrome, unspecified: Secondary | ICD-10-CM | POA: Diagnosis not present

## 2014-12-10 DIAGNOSIS — K116 Mucocele of salivary gland: Secondary | ICD-10-CM | POA: Diagnosis not present

## 2014-12-19 DIAGNOSIS — F70 Mild intellectual disabilities: Secondary | ICD-10-CM | POA: Diagnosis not present

## 2014-12-19 DIAGNOSIS — I10 Essential (primary) hypertension: Secondary | ICD-10-CM | POA: Diagnosis not present

## 2014-12-19 DIAGNOSIS — K116 Mucocele of salivary gland: Secondary | ICD-10-CM | POA: Diagnosis not present

## 2014-12-19 DIAGNOSIS — E782 Mixed hyperlipidemia: Secondary | ICD-10-CM | POA: Diagnosis not present

## 2014-12-19 DIAGNOSIS — Q909 Down syndrome, unspecified: Secondary | ICD-10-CM | POA: Diagnosis not present

## 2014-12-19 DIAGNOSIS — E119 Type 2 diabetes mellitus without complications: Secondary | ICD-10-CM | POA: Diagnosis not present

## 2015-01-08 DIAGNOSIS — Z79899 Other long term (current) drug therapy: Secondary | ICD-10-CM | POA: Diagnosis not present

## 2015-01-16 DIAGNOSIS — E119 Type 2 diabetes mellitus without complications: Secondary | ICD-10-CM | POA: Diagnosis not present

## 2015-01-16 DIAGNOSIS — Q909 Down syndrome, unspecified: Secondary | ICD-10-CM | POA: Diagnosis not present

## 2015-01-16 DIAGNOSIS — I10 Essential (primary) hypertension: Secondary | ICD-10-CM | POA: Diagnosis not present

## 2015-01-30 DIAGNOSIS — I10 Essential (primary) hypertension: Secondary | ICD-10-CM | POA: Diagnosis not present

## 2015-01-30 DIAGNOSIS — E119 Type 2 diabetes mellitus without complications: Secondary | ICD-10-CM | POA: Diagnosis not present

## 2015-01-30 DIAGNOSIS — F70 Mild intellectual disabilities: Secondary | ICD-10-CM | POA: Diagnosis not present

## 2015-02-13 DIAGNOSIS — Q909 Down syndrome, unspecified: Secondary | ICD-10-CM | POA: Diagnosis not present

## 2015-02-13 DIAGNOSIS — E119 Type 2 diabetes mellitus without complications: Secondary | ICD-10-CM | POA: Diagnosis not present

## 2015-02-13 DIAGNOSIS — I10 Essential (primary) hypertension: Secondary | ICD-10-CM | POA: Diagnosis not present

## 2015-03-05 DIAGNOSIS — E1165 Type 2 diabetes mellitus with hyperglycemia: Secondary | ICD-10-CM | POA: Diagnosis not present

## 2015-03-26 DIAGNOSIS — Z79899 Other long term (current) drug therapy: Secondary | ICD-10-CM | POA: Diagnosis not present

## 2015-04-03 DIAGNOSIS — Q909 Down syndrome, unspecified: Secondary | ICD-10-CM | POA: Diagnosis not present

## 2015-04-03 DIAGNOSIS — E781 Pure hyperglyceridemia: Secondary | ICD-10-CM | POA: Diagnosis not present

## 2015-04-03 DIAGNOSIS — E119 Type 2 diabetes mellitus without complications: Secondary | ICD-10-CM | POA: Diagnosis not present

## 2015-04-03 DIAGNOSIS — I10 Essential (primary) hypertension: Secondary | ICD-10-CM | POA: Diagnosis not present

## 2015-04-03 DIAGNOSIS — R079 Chest pain, unspecified: Secondary | ICD-10-CM | POA: Diagnosis not present

## 2015-04-17 DIAGNOSIS — S23421A Sprain of chondrosternal joint, initial encounter: Secondary | ICD-10-CM | POA: Diagnosis not present

## 2015-05-15 DIAGNOSIS — M1990 Unspecified osteoarthritis, unspecified site: Secondary | ICD-10-CM | POA: Diagnosis not present

## 2015-07-03 DIAGNOSIS — J309 Allergic rhinitis, unspecified: Secondary | ICD-10-CM | POA: Diagnosis not present

## 2015-07-03 DIAGNOSIS — E119 Type 2 diabetes mellitus without complications: Secondary | ICD-10-CM | POA: Diagnosis not present

## 2015-07-09 DIAGNOSIS — Z79899 Other long term (current) drug therapy: Secondary | ICD-10-CM | POA: Diagnosis not present

## 2015-07-17 DIAGNOSIS — E119 Type 2 diabetes mellitus without complications: Secondary | ICD-10-CM | POA: Diagnosis not present

## 2015-07-17 DIAGNOSIS — I10 Essential (primary) hypertension: Secondary | ICD-10-CM | POA: Diagnosis not present

## 2015-07-23 DIAGNOSIS — E119 Type 2 diabetes mellitus without complications: Secondary | ICD-10-CM | POA: Diagnosis not present

## 2015-07-23 DIAGNOSIS — I10 Essential (primary) hypertension: Secondary | ICD-10-CM | POA: Diagnosis not present

## 2015-07-23 DIAGNOSIS — E7800 Pure hypercholesterolemia, unspecified: Secondary | ICD-10-CM | POA: Diagnosis not present

## 2015-07-29 DIAGNOSIS — Z79899 Other long term (current) drug therapy: Secondary | ICD-10-CM | POA: Diagnosis not present

## 2015-07-29 DIAGNOSIS — E559 Vitamin D deficiency, unspecified: Secondary | ICD-10-CM | POA: Diagnosis not present

## 2015-07-31 DIAGNOSIS — E559 Vitamin D deficiency, unspecified: Secondary | ICD-10-CM | POA: Diagnosis not present

## 2015-07-31 DIAGNOSIS — E119 Type 2 diabetes mellitus without complications: Secondary | ICD-10-CM | POA: Diagnosis not present

## 2015-08-21 DIAGNOSIS — E119 Type 2 diabetes mellitus without complications: Secondary | ICD-10-CM | POA: Diagnosis not present

## 2015-10-14 DIAGNOSIS — E7800 Pure hypercholesterolemia, unspecified: Secondary | ICD-10-CM | POA: Diagnosis not present

## 2015-10-14 DIAGNOSIS — I10 Essential (primary) hypertension: Secondary | ICD-10-CM | POA: Diagnosis not present

## 2015-10-14 DIAGNOSIS — E119 Type 2 diabetes mellitus without complications: Secondary | ICD-10-CM | POA: Diagnosis not present

## 2016-01-01 DIAGNOSIS — I10 Essential (primary) hypertension: Secondary | ICD-10-CM | POA: Diagnosis not present

## 2016-01-01 DIAGNOSIS — E119 Type 2 diabetes mellitus without complications: Secondary | ICD-10-CM | POA: Diagnosis not present

## 2016-01-22 DIAGNOSIS — Q909 Down syndrome, unspecified: Secondary | ICD-10-CM | POA: Diagnosis not present

## 2016-01-22 DIAGNOSIS — I10 Essential (primary) hypertension: Secondary | ICD-10-CM | POA: Diagnosis not present

## 2016-01-22 DIAGNOSIS — E119 Type 2 diabetes mellitus without complications: Secondary | ICD-10-CM | POA: Diagnosis not present

## 2016-01-29 DIAGNOSIS — E119 Type 2 diabetes mellitus without complications: Secondary | ICD-10-CM | POA: Diagnosis not present

## 2016-02-05 DIAGNOSIS — E119 Type 2 diabetes mellitus without complications: Secondary | ICD-10-CM | POA: Diagnosis not present

## 2016-02-12 DIAGNOSIS — I10 Essential (primary) hypertension: Secondary | ICD-10-CM | POA: Diagnosis not present

## 2016-04-08 DIAGNOSIS — K5901 Slow transit constipation: Secondary | ICD-10-CM | POA: Diagnosis not present

## 2016-04-29 DIAGNOSIS — Z7982 Long term (current) use of aspirin: Secondary | ICD-10-CM | POA: Diagnosis not present

## 2016-04-29 DIAGNOSIS — Z79899 Other long term (current) drug therapy: Secondary | ICD-10-CM | POA: Diagnosis not present

## 2016-04-29 DIAGNOSIS — Q828 Other specified congenital malformations of skin: Secondary | ICD-10-CM | POA: Diagnosis not present

## 2016-04-29 DIAGNOSIS — Z7984 Long term (current) use of oral hypoglycemic drugs: Secondary | ICD-10-CM | POA: Diagnosis not present

## 2016-05-06 DIAGNOSIS — R220 Localized swelling, mass and lump, head: Secondary | ICD-10-CM | POA: Diagnosis not present

## 2016-05-06 DIAGNOSIS — Q909 Down syndrome, unspecified: Secondary | ICD-10-CM | POA: Diagnosis not present

## 2016-06-24 DIAGNOSIS — I10 Essential (primary) hypertension: Secondary | ICD-10-CM | POA: Diagnosis not present

## 2016-06-24 DIAGNOSIS — Q909 Down syndrome, unspecified: Secondary | ICD-10-CM | POA: Diagnosis not present

## 2016-06-24 DIAGNOSIS — E119 Type 2 diabetes mellitus without complications: Secondary | ICD-10-CM | POA: Diagnosis not present

## 2016-07-14 DIAGNOSIS — I10 Essential (primary) hypertension: Secondary | ICD-10-CM | POA: Diagnosis not present

## 2016-07-14 DIAGNOSIS — Z79899 Other long term (current) drug therapy: Secondary | ICD-10-CM | POA: Diagnosis not present

## 2016-07-14 DIAGNOSIS — E559 Vitamin D deficiency, unspecified: Secondary | ICD-10-CM | POA: Diagnosis not present

## 2016-07-14 DIAGNOSIS — E119 Type 2 diabetes mellitus without complications: Secondary | ICD-10-CM | POA: Diagnosis not present

## 2016-07-22 DIAGNOSIS — Q909 Down syndrome, unspecified: Secondary | ICD-10-CM | POA: Diagnosis not present

## 2016-07-22 DIAGNOSIS — I10 Essential (primary) hypertension: Secondary | ICD-10-CM | POA: Diagnosis not present

## 2016-07-22 DIAGNOSIS — E119 Type 2 diabetes mellitus without complications: Secondary | ICD-10-CM | POA: Diagnosis not present

## 2016-08-19 DIAGNOSIS — I10 Essential (primary) hypertension: Secondary | ICD-10-CM | POA: Diagnosis not present

## 2016-08-19 DIAGNOSIS — E119 Type 2 diabetes mellitus without complications: Secondary | ICD-10-CM | POA: Diagnosis not present

## 2016-08-19 DIAGNOSIS — Q909 Down syndrome, unspecified: Secondary | ICD-10-CM | POA: Diagnosis not present

## 2016-08-20 DIAGNOSIS — E201 Pseudohypoparathyroidism: Secondary | ICD-10-CM | POA: Diagnosis not present

## 2016-08-20 DIAGNOSIS — M47816 Spondylosis without myelopathy or radiculopathy, lumbar region: Secondary | ICD-10-CM | POA: Diagnosis not present

## 2016-09-02 DIAGNOSIS — E119 Type 2 diabetes mellitus without complications: Secondary | ICD-10-CM | POA: Diagnosis not present

## 2016-09-02 DIAGNOSIS — Q909 Down syndrome, unspecified: Secondary | ICD-10-CM | POA: Diagnosis not present

## 2016-09-02 DIAGNOSIS — I10 Essential (primary) hypertension: Secondary | ICD-10-CM | POA: Diagnosis not present

## 2016-10-08 ENCOUNTER — Emergency Department (HOSPITAL_COMMUNITY)
Admission: EM | Admit: 2016-10-08 | Discharge: 2016-10-09 | Disposition: A | Discharge status: Home / Self Care | Payer: Medicare Other | Attending: Emergency Medicine | Admitting: Emergency Medicine

## 2016-10-08 ENCOUNTER — Encounter (HOSPITAL_COMMUNITY): Payer: Self-pay

## 2016-10-08 DIAGNOSIS — S01112A Laceration without foreign body of left eyelid and periocular area, initial encounter: Secondary | ICD-10-CM | POA: Insufficient documentation

## 2016-10-08 DIAGNOSIS — I10 Essential (primary) hypertension: Secondary | ICD-10-CM | POA: Diagnosis not present

## 2016-10-08 DIAGNOSIS — Z794 Long term (current) use of insulin: Secondary | ICD-10-CM | POA: Insufficient documentation

## 2016-10-08 DIAGNOSIS — Y92198 Other place in other specified residential institution as the place of occurrence of the external cause: Secondary | ICD-10-CM | POA: Diagnosis not present

## 2016-10-08 DIAGNOSIS — E119 Type 2 diabetes mellitus without complications: Secondary | ICD-10-CM | POA: Diagnosis not present

## 2016-10-08 DIAGNOSIS — Q909 Down syndrome, unspecified: Secondary | ICD-10-CM | POA: Diagnosis not present

## 2016-10-08 DIAGNOSIS — Y9355 Activity, bike riding: Secondary | ICD-10-CM | POA: Diagnosis not present

## 2016-10-08 DIAGNOSIS — S0993XA Unspecified injury of face, initial encounter: Secondary | ICD-10-CM | POA: Diagnosis present

## 2016-10-08 DIAGNOSIS — Y999 Unspecified external cause status: Secondary | ICD-10-CM | POA: Insufficient documentation

## 2016-10-08 MED ORDER — LIDOCAINE-EPINEPHRINE-TETRACAINE (LET) SOLUTION
3.0000 mL | Freq: Once | NASAL | Status: AC
  Administered 2016-10-09: 3 mL via TOPICAL
  Filled 2016-10-08: qty 3
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
## 2016-10-08 NOTE — ED Notes (Signed)
PA at bedside.

## 2016-10-09 DIAGNOSIS — S01112A Laceration without foreign body of left eyelid and periocular area, initial encounter: Secondary | ICD-10-CM | POA: Diagnosis not present
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
## 2016-10-09 NOTE — Discharge Instructions (Signed)
Please read attached information on wound care.  CHECK WOUND APPEARANCE  Some swelling, redness, and pain are common with all wounds and normally will go away as the wound heals. If swelling, redness, or pain increases or if the wound feels warm to the touch, contact a doctor. Also contact a doctor if the wound edges reopen or separate.  REPLACE BANDAGES  If your wound is bandaged, keep the bandage dry.   Replace the dressing daily until the adhesive film has fallen off or if the bandage should become wet, unless otherwise instructed by your physician.  When changing the dressing, do not place tape directly over the DERMABOND adhesive film, because removing the tape later may also remove the film.  AVOID TOPICAL MEDICATIONS  Do not apply liquid or ointment medications or any other product to your wound while the DERMABOND adhesive film is in place. These may loosen the film before your wound is healed. KEEP WOUND DRY AND PROTECTED  You may occasionally and briefly wet your wound in the shower or bath. Do not soak or scrub your wound, do not swim, and avoid periods of heavy perspiration until the DERMABOND adhesive has naturally fallen off. After showering or bathing, gently blot your wound dry with a soft towel. If a protective dressing is being used, apply a fresh, dry bandage, being sure to keep the tape off the DERMABOND adhesive film.  Apply a clean, dry bandage over the wound if necessary to protect it.  Protect your wound from injury until the skin has had sufficient time to heal.  Do not scratch, rub, or pick at the DERMABOND adhesive film. This may loosen the film before your wound is healed.  Protect the wound from prolonged exposure to sunlight or tanning lamps while the film is in place.  If you have any questions or concerns about this product, please consult your doctor

## 2016-10-14 DIAGNOSIS — S0990XD Unspecified injury of head, subsequent encounter: Secondary | ICD-10-CM | POA: Diagnosis not present

## 2016-10-21 DIAGNOSIS — L0390 Cellulitis, unspecified: Secondary | ICD-10-CM | POA: Diagnosis not present

## 2016-10-21 DIAGNOSIS — Q909 Down syndrome, unspecified: Secondary | ICD-10-CM | POA: Diagnosis not present

## 2016-10-21 DIAGNOSIS — E119 Type 2 diabetes mellitus without complications: Secondary | ICD-10-CM | POA: Diagnosis not present

## 2017-02-16 DIAGNOSIS — I10 Essential (primary) hypertension: Secondary | ICD-10-CM | POA: Diagnosis not present

## 2017-02-16 DIAGNOSIS — Z79899 Other long term (current) drug therapy: Secondary | ICD-10-CM | POA: Diagnosis not present

## 2017-02-16 DIAGNOSIS — E785 Hyperlipidemia, unspecified: Secondary | ICD-10-CM | POA: Diagnosis not present

## 2017-02-17 DIAGNOSIS — K5900 Constipation, unspecified: Secondary | ICD-10-CM | POA: Diagnosis not present

## 2017-02-17 DIAGNOSIS — E119 Type 2 diabetes mellitus without complications: Secondary | ICD-10-CM | POA: Diagnosis not present

## 2017-02-17 DIAGNOSIS — E785 Hyperlipidemia, unspecified: Secondary | ICD-10-CM | POA: Diagnosis not present

## 2017-02-17 DIAGNOSIS — Q909 Down syndrome, unspecified: Secondary | ICD-10-CM | POA: Diagnosis not present

## 2017-02-17 DIAGNOSIS — F70 Mild intellectual disabilities: Secondary | ICD-10-CM | POA: Diagnosis not present

## 2017-02-17 DIAGNOSIS — I10 Essential (primary) hypertension: Secondary | ICD-10-CM | POA: Diagnosis not present

## 2017-04-14 DIAGNOSIS — I10 Essential (primary) hypertension: Secondary | ICD-10-CM | POA: Diagnosis not present

## 2017-04-14 DIAGNOSIS — E119 Type 2 diabetes mellitus without complications: Secondary | ICD-10-CM | POA: Diagnosis not present

## 2017-04-14 DIAGNOSIS — E782 Mixed hyperlipidemia: Secondary | ICD-10-CM | POA: Diagnosis not present

## 2017-04-14 DIAGNOSIS — F40243 Fear of flying: Secondary | ICD-10-CM | POA: Diagnosis not present

## 2017-04-14 DIAGNOSIS — F70 Mild intellectual disabilities: Secondary | ICD-10-CM | POA: Diagnosis not present

## 2017-05-19 DIAGNOSIS — Q909 Down syndrome, unspecified: Secondary | ICD-10-CM | POA: Diagnosis not present

## 2017-05-19 DIAGNOSIS — M25511 Pain in right shoulder: Secondary | ICD-10-CM | POA: Diagnosis not present

## 2017-05-19 DIAGNOSIS — Z6833 Body mass index (BMI) 33.0-33.9, adult: Secondary | ICD-10-CM | POA: Diagnosis not present

## 2017-05-19 DIAGNOSIS — E119 Type 2 diabetes mellitus without complications: Secondary | ICD-10-CM | POA: Diagnosis not present

## 2017-05-19 DIAGNOSIS — E782 Mixed hyperlipidemia: Secondary | ICD-10-CM | POA: Diagnosis not present

## 2017-05-27 DIAGNOSIS — R52 Pain, unspecified: Secondary | ICD-10-CM | POA: Diagnosis not present

## 2017-05-27 DIAGNOSIS — M25511 Pain in right shoulder: Secondary | ICD-10-CM | POA: Diagnosis not present

## 2017-06-02 DIAGNOSIS — Z6833 Body mass index (BMI) 33.0-33.9, adult: Secondary | ICD-10-CM | POA: Diagnosis not present

## 2017-06-02 DIAGNOSIS — E119 Type 2 diabetes mellitus without complications: Secondary | ICD-10-CM | POA: Diagnosis not present

## 2017-06-02 DIAGNOSIS — M25511 Pain in right shoulder: Secondary | ICD-10-CM | POA: Diagnosis not present

## 2017-06-02 DIAGNOSIS — Q909 Down syndrome, unspecified: Secondary | ICD-10-CM | POA: Diagnosis not present

## 2017-06-16 DIAGNOSIS — E782 Mixed hyperlipidemia: Secondary | ICD-10-CM | POA: Diagnosis not present

## 2017-06-16 DIAGNOSIS — Q909 Down syndrome, unspecified: Secondary | ICD-10-CM | POA: Diagnosis not present

## 2017-06-16 DIAGNOSIS — E119 Type 2 diabetes mellitus without complications: Secondary | ICD-10-CM | POA: Diagnosis not present

## 2017-06-22 DIAGNOSIS — E119 Type 2 diabetes mellitus without complications: Secondary | ICD-10-CM | POA: Diagnosis not present

## 2017-06-22 DIAGNOSIS — E782 Mixed hyperlipidemia: Secondary | ICD-10-CM | POA: Diagnosis not present

## 2017-06-22 DIAGNOSIS — Q909 Down syndrome, unspecified: Secondary | ICD-10-CM | POA: Diagnosis not present

## 2017-07-15 DIAGNOSIS — I10 Essential (primary) hypertension: Secondary | ICD-10-CM | POA: Diagnosis not present

## 2017-07-15 DIAGNOSIS — Z794 Long term (current) use of insulin: Secondary | ICD-10-CM | POA: Diagnosis not present

## 2017-07-15 DIAGNOSIS — E559 Vitamin D deficiency, unspecified: Secondary | ICD-10-CM | POA: Diagnosis not present

## 2017-07-15 DIAGNOSIS — R809 Proteinuria, unspecified: Secondary | ICD-10-CM | POA: Diagnosis not present

## 2017-07-15 DIAGNOSIS — E785 Hyperlipidemia, unspecified: Secondary | ICD-10-CM | POA: Diagnosis not present

## 2017-07-21 DIAGNOSIS — I10 Essential (primary) hypertension: Secondary | ICD-10-CM | POA: Diagnosis not present

## 2017-07-21 DIAGNOSIS — E1165 Type 2 diabetes mellitus with hyperglycemia: Secondary | ICD-10-CM | POA: Diagnosis not present

## 2017-07-21 DIAGNOSIS — F70 Mild intellectual disabilities: Secondary | ICD-10-CM | POA: Diagnosis not present

## 2017-07-21 DIAGNOSIS — E559 Vitamin D deficiency, unspecified: Secondary | ICD-10-CM | POA: Diagnosis not present

## 2017-07-21 DIAGNOSIS — E785 Hyperlipidemia, unspecified: Secondary | ICD-10-CM | POA: Diagnosis not present

## 2017-08-04 DIAGNOSIS — E119 Type 2 diabetes mellitus without complications: Secondary | ICD-10-CM | POA: Diagnosis not present

## 2017-08-04 DIAGNOSIS — R7982 Elevated C-reactive protein (CRP): Secondary | ICD-10-CM | POA: Diagnosis not present

## 2017-08-04 DIAGNOSIS — Z794 Long term (current) use of insulin: Secondary | ICD-10-CM | POA: Diagnosis not present

## 2017-08-12 DIAGNOSIS — E782 Mixed hyperlipidemia: Secondary | ICD-10-CM | POA: Diagnosis not present

## 2017-08-12 DIAGNOSIS — E119 Type 2 diabetes mellitus without complications: Secondary | ICD-10-CM | POA: Diagnosis not present

## 2017-08-12 DIAGNOSIS — Q909 Down syndrome, unspecified: Secondary | ICD-10-CM | POA: Diagnosis not present

## 2017-08-17 ENCOUNTER — Ambulatory Visit (INDEPENDENT_AMBULATORY_CARE_PROVIDER_SITE_OTHER): Payer: Medicare Other | Admitting: Endocrinology

## 2017-08-17 ENCOUNTER — Encounter: Payer: Self-pay | Admitting: Endocrinology

## 2017-08-17 VITALS — BP 160/110 | HR 90 | Temp 98.1°F | Wt 209.8 lb

## 2017-08-17 DIAGNOSIS — Z794 Long term (current) use of insulin: Secondary | ICD-10-CM | POA: Diagnosis not present

## 2017-08-17 DIAGNOSIS — E119 Type 2 diabetes mellitus without complications: Secondary | ICD-10-CM | POA: Diagnosis not present

## 2017-08-17 LAB — POCT GLYCOSYLATED HEMOGLOBIN (HGB A1C): HEMOGLOBIN A1C: 10.1

## 2017-08-17 MED ORDER — INSULIN LISPRO PROT & LISPRO (75-25 MIX) 100 UNIT/ML ~~LOC~~ SUSP: SUBCUTANEOUS | Status: DC

## 2017-08-17 NOTE — Patient Instructions (Signed)
good diet and exercise significantly improve the control of your diabetes.  please let me know if you wish to be referred to a dietician.  high blood sugar is very risky to your health.  you should see an eye doctor and dentist every year.  It is very important to get all recommended vaccinations.  Controlling your blood pressure and cholesterol drastically reduces the damage diabetes does to your body.  Those who smoke should quit.  Please discuss these with your doctor.  check your blood sugar twice a day.  vary the time of day when you check, between before the 3 meals, and at bedtime.  also check if you have symptoms of your blood sugar being too high or too low.  please keep a record of the readings and bring it to your next appointment here (or you can bring the meter itself).  You can write it on any piece of paper.  please call us sooner if your blood sugar goes below 70, or if most readings are over 200. On this type of insulin schedule, you should eat meals on a regular schedule.  If a meal is missed or significantly delayed, your blood sugar could go low. Please come back for a follow-up appointment in 3 months Please forward cbg record in 1 week.

## 2017-08-17 NOTE — Progress Notes (Signed)
Subjective:    Patient ID: Gregory Kennedy, male    DOB: 1976-05-06, 42 y.o.   MRN: [id number]  HPI pt is referred by Dr Gregory Kennedy, for diabetes.  Pt states DM was dx'ed in 2011; he has mild if any neuropathy of the lower extremities; he is unaware of any associated chronic complications; he has been on insulin since 2012; pt says his diet and exercise are poor; he has never had pancreatitis, pancreatic surgery, or DKA.  Hx is from caretaker, due to [place].  Last episode of severe hypoglycemia was 2013.  He takes qd insulin, due to low functional status.  He lives in a group home, where insulin is brought to him.  cbg's are checked there, but record of that is not available.  Caretaker says cbg's are are always over 200.  There is no trend throughout the day.  He takes 75/25 insulin, 15 units with breakfast, and 12 units with supper.   Past Medical History:  Diagnosis Date  . Diabetes mellitus   . Down syndrome   . Hypertension     No past surgical history on file.  Social History   Socioeconomic History  . Marital status: Single    Spouse name: Not on file  . Number of children: Not on file  . Years of education: Not on file  . Highest education level: Not on file  Occupational History  . Not on file  Social Needs  . Financial resource strain: Not on file  . Food insecurity:    Worry: Not on file    Inability: Not on file  . Transportation needs:    Medical: Not on file    Non-medical: Not on file  Tobacco Use  . Smoking status: Never Smoker  . Smokeless tobacco: Never Used  Substance and Sexual Activity  . Alcohol use: No  . Drug use: No  . Sexual activity: Not on file  Lifestyle  . Physical activity:    Days per week: Not on file    Minutes per session: Not on file  . Stress: Not on file  Relationships  . Social connections:    Talks on phone: Not on file    Gets together: Not on file    Attends religious service: Not on file    Active member of club or  organization: Not on file    Attends meetings of clubs or organizations: Not on file    Relationship status: Not on file  . Intimate partner violence:    Fear of current or ex partner: Not on file    Emotionally abused: Not on file    Physically abused: Not on file    Forced sexual activity: Not on file  Other Topics Concern  . Not on file  Social History Narrative  . Not on file    Current Outpatient Medications on File Prior to Visit  Medication Sig Dispense Refill  . amLODipine (NORVASC) 10 MG tablet Take 10 mg by mouth daily.    . enalapril (VASOTEC) 20 MG tablet Take 20 mg by mouth daily.    . hydrochlorothiazide (HYDRODIURIL) 25 MG tablet Take 25 mg by mouth daily.    . metFORMIN (GLUCOPHAGE) 500 MG tablet Take 500 mg by mouth 2 (two) times daily with a meal.    . naltrexone (DEPADE) 50 MG tablet Take 50 mg by mouth daily.    . Vitamin D, Ergocalciferol, (DRISDOL) 50000 UNITS CAPS Take 50,000 Units by mouth every 30 (  thirty) days. Take on the 14th     No current facility-administered medications on file prior to visit.     No Known Allergies  Family History  Problem Relation Age of Onset  . Diabetes Sister     BP (!) 160/110 (BP Location: Right Arm, Patient Position: Sitting, Cuff Size: Normal)   Pulse 90   Temp 98.1 F (36.7 C) (Oral)   Wt 209 lb 12.8 oz (95.2 kg)   SpO2 97%   BMI 30.98 kg/m    Review of Systems denies weight loss, blurry vision, headache, chest pain, sob, muscle cramps, excessive diaphoresis, memory loss, depression, hypoglycemia, cold intolerance, rhinorrhea, and easy bruising.  He has constipation and frequent urination.    Objective:   Physical Exam VS: see vs page GEN: no distress HEAD: head: no deformity eyes: no periorbital swelling, no proptosis external nose and ears are normal mouth: no lesion seen NECK: supple, thyroid is not enlarged CHEST WALL: no deformity LUNGS: clear to auscultation CV: reg rate and rhythm, no  murmur ABD: abdomen is soft, nontender.  no hepatosplenomegaly.  not distended.  no hernia MUSCULOSKELETAL: muscle bulk and strength are grossly normal.  no obvious joint swelling.  gait is normal and steady EXTEMITIES: no deformity.  no ulcer on the feet.  feet are of normal color and temp.  no edema PULSES: dorsalis pedis intact bilat.  no carotid bruit NEURO:  cn 2-12 grossly intact.   readily moves all 4's.  sensation is intact to touch on the feet SKIN:  Normal texture and temperature.  No rash or suspicious lesion is visible.   NODES:  None palpable at the neck PSYCH: alert, well-oriented.  Does not appear anxious nor depressed.   Lab Results  Component Value Date   HGBA1C 10.1 08/17/2017       Assessment & Plan:  Insulin-requiring type 2 DM: severe exacerbation.  HTN: recheck with PCP.  Patient Instructions  good diet and exercise significantly improve the control of your diabetes.  please let me know if you wish to be referred to a dietician.  high blood sugar is very risky to your health.  you should see an eye doctor and dentist every year.  It is very important to get all recommended vaccinations.  Controlling your blood pressure and cholesterol drastically reduces the damage diabetes does to your body.  Those who smoke should quit.  Please discuss these with your doctor.  check your blood sugar twice a day.  vary the time of day when you check, between before the 3 meals, and at bedtime.  also check if you have symptoms of your blood sugar being too high or too low.  please keep a record of the readings and bring it to your next appointment here (or you can bring the meter itself).  You can write it on any piece of paper.  please call us sooner if your blood sugar goes below 70, or if most readings are over 200. On this type of insulin schedule, you should eat meals on a regular schedule.  If a meal is missed or significantly delayed, your blood sugar could go low. Please come  back for a follow-up appointment in 3 months Please forward cbg record in 1 week.

## 2017-08-18 DIAGNOSIS — M1990 Unspecified osteoarthritis, unspecified site: Secondary | ICD-10-CM | POA: Diagnosis not present

## 2017-08-18 DIAGNOSIS — I10 Essential (primary) hypertension: Secondary | ICD-10-CM | POA: Diagnosis not present

## 2017-08-18 DIAGNOSIS — E1165 Type 2 diabetes mellitus with hyperglycemia: Secondary | ICD-10-CM | POA: Diagnosis not present

## 2017-08-18 DIAGNOSIS — Q909 Down syndrome, unspecified: Secondary | ICD-10-CM | POA: Diagnosis not present

## 2017-08-18 DIAGNOSIS — F70 Mild intellectual disabilities: Secondary | ICD-10-CM | POA: Diagnosis not present

## 2017-08-25 ENCOUNTER — Telehealth: Payer: Self-pay | Admitting: Emergency Medicine

## 2017-08-25 NOTE — Telephone Encounter (Signed)
Release of info would be needed.  I sent cc to PCP

## 2017-08-25 NOTE — Telephone Encounter (Signed)
Cameron called and would like a copy of patients prescription and dictation notes for patient from 08/17/17. Fax number is [phone]. It is ok to speak to nurse Anderson Malta if Gabriel Cirri is unavailable. Thanks.

## 2017-09-15 DIAGNOSIS — E119 Type 2 diabetes mellitus without complications: Secondary | ICD-10-CM | POA: Diagnosis not present

## 2017-09-15 DIAGNOSIS — F70 Mild intellectual disabilities: Secondary | ICD-10-CM | POA: Diagnosis not present

## 2017-09-15 DIAGNOSIS — E785 Hyperlipidemia, unspecified: Secondary | ICD-10-CM | POA: Diagnosis not present

## 2017-09-15 DIAGNOSIS — I10 Essential (primary) hypertension: Secondary | ICD-10-CM | POA: Diagnosis not present

## 2017-09-15 DIAGNOSIS — R21 Rash and other nonspecific skin eruption: Secondary | ICD-10-CM | POA: Diagnosis not present

## 2017-09-15 DIAGNOSIS — L299 Pruritus, unspecified: Secondary | ICD-10-CM | POA: Diagnosis not present

## 2017-10-07 DIAGNOSIS — E119 Type 2 diabetes mellitus without complications: Secondary | ICD-10-CM | POA: Diagnosis not present

## 2017-10-13 DIAGNOSIS — E782 Mixed hyperlipidemia: Secondary | ICD-10-CM | POA: Diagnosis not present

## 2017-10-13 DIAGNOSIS — E119 Type 2 diabetes mellitus without complications: Secondary | ICD-10-CM | POA: Diagnosis not present

## 2017-10-13 DIAGNOSIS — Z6833 Body mass index (BMI) 33.0-33.9, adult: Secondary | ICD-10-CM | POA: Diagnosis not present

## 2017-10-13 DIAGNOSIS — Q909 Down syndrome, unspecified: Secondary | ICD-10-CM | POA: Diagnosis not present

## 2017-10-19 ENCOUNTER — Other Ambulatory Visit: Payer: Self-pay

## 2017-10-19 MED ORDER — INSULIN LISPRO PROT & LISPRO (75-25 MIX) 100 UNIT/ML ~~LOC~~ SUSP: SUBCUTANEOUS | 0 refills | Status: DC

## 2017-10-20 ENCOUNTER — Other Ambulatory Visit: Payer: Self-pay

## 2017-10-20 MED ORDER — INSULIN LISPRO PROT & LISPRO (75-25 MIX) 100 UNIT/ML ~~LOC~~ SUSP: SUBCUTANEOUS | 0 refills | Status: DC

## 2017-11-10 ENCOUNTER — Telehealth: Payer: Self-pay | Admitting: Endocrinology

## 2017-11-10 DIAGNOSIS — Q909 Down syndrome, unspecified: Secondary | ICD-10-CM | POA: Diagnosis not present

## 2017-11-10 DIAGNOSIS — E119 Type 2 diabetes mellitus without complications: Secondary | ICD-10-CM | POA: Diagnosis not present

## 2017-11-10 DIAGNOSIS — E782 Mixed hyperlipidemia: Secondary | ICD-10-CM | POA: Diagnosis not present

## 2017-11-10 NOTE — Telephone Encounter (Signed)
I have faxed over patient correct dosages for Humalog 75/25.

## 2017-11-10 NOTE — Telephone Encounter (Signed)
[facility], need to know if Loanne Drilling changed patient prescription for his insulin lispro protamine-lispro (HUMALOG 75/25 MIX) (75-25) 100 UNIT/ML SUSP injection [[id number]]   Fax [phone]

## 2017-11-29 ENCOUNTER — Ambulatory Visit: Payer: Medicare Other | Admitting: Endocrinology

## 2017-11-29 ENCOUNTER — Encounter: Payer: Medicare Other | Admitting: Dietician

## 2017-12-15 DIAGNOSIS — Q909 Down syndrome, unspecified: Secondary | ICD-10-CM | POA: Diagnosis not present

## 2017-12-15 DIAGNOSIS — E119 Type 2 diabetes mellitus without complications: Secondary | ICD-10-CM | POA: Diagnosis not present

## 2017-12-15 DIAGNOSIS — E782 Mixed hyperlipidemia: Secondary | ICD-10-CM | POA: Diagnosis not present

## 2018-01-05 ENCOUNTER — Ambulatory Visit (INDEPENDENT_AMBULATORY_CARE_PROVIDER_SITE_OTHER): Payer: Medicare Other | Admitting: Endocrinology

## 2018-01-05 ENCOUNTER — Encounter: Payer: Self-pay | Admitting: Endocrinology

## 2018-01-05 VITALS — BP 140/88 | HR 86 | Ht 69.0 in | Wt 208.0 lb

## 2018-01-05 DIAGNOSIS — E119 Type 2 diabetes mellitus without complications: Secondary | ICD-10-CM

## 2018-01-05 DIAGNOSIS — Z794 Long term (current) use of insulin: Secondary | ICD-10-CM | POA: Diagnosis not present

## 2018-01-05 LAB — POCT GLYCOSYLATED HEMOGLOBIN (HGB A1C): HEMOGLOBIN A1C: 7.6 % — AB (ref 4.0–5.6)

## 2018-01-05 NOTE — Progress Notes (Signed)
Subjective:    Patient ID: Gregory Kennedy, male    DOB: 1975/12/14, 42 y.o.   MRN: [id number]  HPI Pt returns for f/u of diabetes mellitus: DM type: Insulin-requiring type 2 Dx'ed: 4098 Complications: none Therapy: insulin since 2012 DKA: never Severe hypoglycemia: last episode was 2013 Pancreatitis: never Pancreatic imaging: never Other: he takes qd insulin, due to low functional status; he lives in a group home, where insulin is brought and admisistered to him Interval history: caretaker brings a record of her cbg's which I have reviewed today.  cbg varies from 109-219.  There is no trend throughout the day.  pt states he feels well in general.  Past Medical History:  Diagnosis Date  . Diabetes mellitus   . Down syndrome   . Hypertension     No past surgical history on file.  Social History   Socioeconomic History  . Marital status: Single    Spouse name: Not on file  . Number of children: Not on file  . Years of education: Not on file  . Highest education level: Not on file  Occupational History  . Not on file  Social Needs  . Financial resource strain: Not on file  . Food insecurity:    Worry: Not on file    Inability: Not on file  . Transportation needs:    Medical: Not on file    Non-medical: Not on file  Tobacco Use  . Smoking status: Never Smoker  . Smokeless tobacco: Never Used  Substance and Sexual Activity  . Alcohol use: No  . Drug use: No  . Sexual activity: Not on file  Lifestyle  . Physical activity:    Days per week: Not on file    Minutes per session: Not on file  . Stress: Not on file  Relationships  . Social connections:    Talks on phone: Not on file    Gets together: Not on file    Attends religious service: Not on file    Active member of club or organization: Not on file    Attends meetings of clubs or organizations: Not on file    Relationship status: Not on file  . Intimate partner violence:    Fear of current or ex partner:  Not on file    Emotionally abused: Not on file    Physically abused: Not on file    Forced sexual activity: Not on file  Other Topics Concern  . Not on file  Social History Narrative  . Not on file    Current Outpatient Medications on File Prior to Visit  Medication Sig Dispense Refill  . amLODipine (NORVASC) 10 MG tablet Take 10 mg by mouth daily.    Marland Kitchen atorvastatin (LIPITOR) 20 MG tablet Take 20 mg by mouth daily.    Marland Kitchen docusate sodium (COLACE) 100 MG capsule Take 100 mg by mouth 2 (two) times daily.    . enalapril (VASOTEC) 20 MG tablet Take 20 mg by mouth daily.    . hydrochlorothiazide (HYDRODIURIL) 25 MG tablet Take 25 mg by mouth daily.    . insulin lispro protamine-lispro (HUMALOG 75/25 MIX) (75-25) 100 UNIT/ML SUSP injection 20 units with breakfast, and 15 units with supper 20 mL 0  . metFORMIN (GLUCOPHAGE) 500 MG tablet Take 500 mg by mouth 2 (two) times daily with a meal.    . naltrexone (DEPADE) 50 MG tablet Take 50 mg by mouth daily.    . TRUE METRIX BLOOD GLUCOSE TEST  test strip USE TO TEST 3 TIMES A DAY. DX E10.9  6  . Vitamin D, Ergocalciferol, (DRISDOL) 50000 UNITS CAPS Take 50,000 Units by mouth every 30 (thirty) days. Take on the 14th     No current facility-administered medications on file prior to visit.     No Known Allergies  Family History  Problem Relation Age of Onset  . Diabetes Sister     BP 140/88 (BP Location: Left Arm, Patient Position: Sitting, Cuff Size: Normal)   Pulse 86   Ht 5\' 9"  (1.753 m)   Wt 208 lb (94.3 kg)   SpO2 97%   BMI 30.72 kg/m   Review of Systems denies hypoglycemia    Objective:   Physical Exam VITAL SIGNS:  See vs page GENERAL: no distress Pulses: dorsalis pedis intact bilat.   MSK: no deformity of the feet CV: trace bilat leg edema Skin:  no ulcer on the feet.  normal color and temp on the feet. Neuro: sensation is intact to touch on the feet.  Lab Results  Component Value Date   HGBA1C 7.6 (A) 01/05/2018        Assessment & Plan:  Insulin-requiring type 2 DM: this is the best control this pt should aim for, given this regimen, which does match insulin to her changing needs throughout the day Down's syndrome: he needs qd insulin.    Patient Instructions  check your blood sugar twice a day.  vary the time of day when you check, between before the 3 meals, and at bedtime.  also check if you have symptoms of your blood sugar being too high or too low.  please keep a record of the readings and bring it to your next appointment here (or you can bring the meter itself).  You can write it on any piece of paper.  please call us sooner if your blood sugar goes below 70, or if most readings are over 200. Please continue the same insulin On this type of insulin schedule, you should eat meals on a regular schedule.  If a meal is missed or significantly delayed, your blood sugar could go low. Please come back for a follow-up appointment in 3 months.

## 2018-01-05 NOTE — Patient Instructions (Addendum)
check your blood sugar twice a day.  vary the time of day when you check, between before the 3 meals, and at bedtime.  also check if you have symptoms of your blood sugar being too high or too low.  please keep a record of the readings and bring it to your next appointment here (or you can bring the meter itself).  You can write it on any piece of paper.  please call us sooner if your blood sugar goes below 70, or if most readings are over 200. Please continue the same insulin On this type of insulin schedule, you should eat meals on a regular schedule.  If a meal is missed or significantly delayed, your blood sugar could go low. Please come back for a follow-up appointment in 3 months.

## 2018-01-12 DIAGNOSIS — Z6833 Body mass index (BMI) 33.0-33.9, adult: Secondary | ICD-10-CM | POA: Diagnosis not present

## 2018-01-12 DIAGNOSIS — E119 Type 2 diabetes mellitus without complications: Secondary | ICD-10-CM | POA: Diagnosis not present

## 2018-01-12 DIAGNOSIS — E782 Mixed hyperlipidemia: Secondary | ICD-10-CM | POA: Diagnosis not present

## 2018-01-12 DIAGNOSIS — Q909 Down syndrome, unspecified: Secondary | ICD-10-CM | POA: Diagnosis not present

## 2018-01-19 DIAGNOSIS — Z6833 Body mass index (BMI) 33.0-33.9, adult: Secondary | ICD-10-CM | POA: Diagnosis not present

## 2018-01-19 DIAGNOSIS — K5900 Constipation, unspecified: Secondary | ICD-10-CM | POA: Diagnosis not present

## 2018-01-19 DIAGNOSIS — E119 Type 2 diabetes mellitus without complications: Secondary | ICD-10-CM | POA: Diagnosis not present

## 2018-01-19 DIAGNOSIS — I10 Essential (primary) hypertension: Secondary | ICD-10-CM | POA: Diagnosis not present

## 2018-01-19 DIAGNOSIS — F70 Mild intellectual disabilities: Secondary | ICD-10-CM | POA: Diagnosis not present

## 2018-01-19 DIAGNOSIS — M1990 Unspecified osteoarthritis, unspecified site: Secondary | ICD-10-CM | POA: Diagnosis not present

## 2018-02-23 DIAGNOSIS — Z6832 Body mass index (BMI) 32.0-32.9, adult: Secondary | ICD-10-CM | POA: Diagnosis not present

## 2018-02-23 DIAGNOSIS — E119 Type 2 diabetes mellitus without complications: Secondary | ICD-10-CM | POA: Diagnosis not present

## 2018-02-23 DIAGNOSIS — E782 Mixed hyperlipidemia: Secondary | ICD-10-CM | POA: Diagnosis not present

## 2018-02-23 DIAGNOSIS — Q909 Down syndrome, unspecified: Secondary | ICD-10-CM | POA: Diagnosis not present

## 2018-02-24 DIAGNOSIS — E559 Vitamin D deficiency, unspecified: Secondary | ICD-10-CM | POA: Diagnosis not present

## 2018-02-24 DIAGNOSIS — R945 Abnormal results of liver function studies: Secondary | ICD-10-CM | POA: Diagnosis not present

## 2018-02-24 DIAGNOSIS — E785 Hyperlipidemia, unspecified: Secondary | ICD-10-CM | POA: Diagnosis not present

## 2018-02-24 DIAGNOSIS — E119 Type 2 diabetes mellitus without complications: Secondary | ICD-10-CM | POA: Diagnosis not present

## 2018-02-24 DIAGNOSIS — R6889 Other general symptoms and signs: Secondary | ICD-10-CM | POA: Diagnosis not present

## 2018-03-16 DIAGNOSIS — Q909 Down syndrome, unspecified: Secondary | ICD-10-CM | POA: Diagnosis not present

## 2018-03-16 DIAGNOSIS — Z6833 Body mass index (BMI) 33.0-33.9, adult: Secondary | ICD-10-CM | POA: Diagnosis not present

## 2018-03-16 DIAGNOSIS — E782 Mixed hyperlipidemia: Secondary | ICD-10-CM | POA: Diagnosis not present

## 2018-03-16 DIAGNOSIS — E119 Type 2 diabetes mellitus without complications: Secondary | ICD-10-CM | POA: Diagnosis not present

## 2018-03-16 DIAGNOSIS — L249 Irritant contact dermatitis, unspecified cause: Secondary | ICD-10-CM | POA: Diagnosis not present

## 2018-04-13 ENCOUNTER — Ambulatory Visit (INDEPENDENT_AMBULATORY_CARE_PROVIDER_SITE_OTHER): Payer: Medicare Other | Admitting: Endocrinology

## 2018-04-13 ENCOUNTER — Encounter: Payer: Self-pay | Admitting: Endocrinology

## 2018-04-13 VITALS — BP 118/78 | HR 65 | Ht 69.0 in | Wt 209.4 lb

## 2018-04-13 DIAGNOSIS — Z794 Long term (current) use of insulin: Secondary | ICD-10-CM

## 2018-04-13 DIAGNOSIS — Z6833 Body mass index (BMI) 33.0-33.9, adult: Secondary | ICD-10-CM | POA: Diagnosis not present

## 2018-04-13 DIAGNOSIS — Q909 Down syndrome, unspecified: Secondary | ICD-10-CM | POA: Diagnosis not present

## 2018-04-13 DIAGNOSIS — E782 Mixed hyperlipidemia: Secondary | ICD-10-CM | POA: Diagnosis not present

## 2018-04-13 DIAGNOSIS — E119 Type 2 diabetes mellitus without complications: Secondary | ICD-10-CM | POA: Diagnosis not present

## 2018-04-13 LAB — POCT GLYCOSYLATED HEMOGLOBIN (HGB A1C): Hemoglobin A1C: 6.6 % — AB (ref 4.0–5.6)

## 2018-04-13 MED ORDER — INSULIN LISPRO PROT & LISPRO (75-25 MIX) 100 UNIT/ML ~~LOC~~ SUSP: SUBCUTANEOUS | 0 refills | Status: DC

## 2018-04-13 NOTE — Patient Instructions (Addendum)
check your blood sugar twice a day.  vary the time of day when you check, between before the 3 meals, and at bedtime.  also check if you have symptoms of your blood sugar being too high or too low.  please keep a record of the readings and bring it to your next appointment here (or you can bring the meter itself).  You can write it on any piece of paper.  please call us sooner if your blood sugar goes below 70, or if most readings are over 200. Please reduce the insulin to 20 units with breakfast, and 13 units with supper On this type of insulin schedule, you should eat meals on a regular schedule.  If a meal is missed or significantly delayed, your blood sugar could go low. Please come back for a follow-up appointment in 3 months.

## 2018-04-13 NOTE — Progress Notes (Signed)
Subjective:    Patient ID: Gregory Kennedy, male    DOB: 1976/01/07, 42 y.o.   MRN: [id number]  HPI Pt returns for f/u of diabetes mellitus: DM type: Insulin-requiring type 2 Dx'ed: [id number] Complications: none Therapy: insulin since 2012 DKA: never Severe hypoglycemia: last episode was 2013 Pancreatitis: never Pancreatic imaging: never Other: he takes qd insulin, due to low functional status; he lives in a group home, where insulin is brought and admisistered to him Interval history: no cbg record, but caretaker says cbg varies from 60-190.  It is in general higher as the day goes on.  pt states he feels well in general.   Past Medical History:  Diagnosis Date  . Diabetes mellitus   . Down syndrome   . Hypertension     No past surgical history on file.  Social History   Socioeconomic History  . Marital status: Single    Spouse name: Not on file  . Number of children: Not on file  . Years of education: Not on file  . Highest education level: Not on file  Occupational History  . Not on file  Social Needs  . Financial resource strain: Not on file  . Food insecurity:    Worry: Not on file    Inability: Not on file  . Transportation needs:    Medical: Not on file    Non-medical: Not on file  Tobacco Use  . Smoking status: Never Smoker  . Smokeless tobacco: Never Used  Substance and Sexual Activity  . Alcohol use: No  . Drug use: No  . Sexual activity: Not on file  Lifestyle  . Physical activity:    Days per week: Not on file    Minutes per session: Not on file  . Stress: Not on file  Relationships  . Social connections:    Talks on phone: Not on file    Gets together: Not on file    Attends religious service: Not on file    Active member of club or organization: Not on file    Attends meetings of clubs or organizations: Not on file    Relationship status: Not on file  . Intimate partner violence:    Fear of current or ex partner: Not on file    Emotionally  abused: Not on file    Physically abused: Not on file    Forced sexual activity: Not on file  Other Topics Concern  . Not on file  Social History Narrative  . Not on file    Current Outpatient Medications on File Prior to Visit  Medication Sig Dispense Refill  . amLODipine (NORVASC) 10 MG tablet Take 10 mg by mouth daily.    Marland Kitchen atorvastatin (LIPITOR) 20 MG tablet Take 20 mg by mouth daily.    Marland Kitchen docusate sodium (COLACE) 100 MG capsule Take 100 mg by mouth 2 (two) times daily.    . enalapril (VASOTEC) 20 MG tablet Take 20 mg by mouth daily.    . hydrochlorothiazide (HYDRODIURIL) 25 MG tablet Take 25 mg by mouth daily.    . metFORMIN (GLUCOPHAGE) 500 MG tablet Take 500 mg by mouth 2 (two) times daily with a meal.    . naltrexone (DEPADE) 50 MG tablet Take 50 mg by mouth daily.    . TRUE METRIX BLOOD GLUCOSE TEST test strip USE TO TEST 3 TIMES A DAY. DX E10.9  6  . Vitamin D, Ergocalciferol, (DRISDOL) 50000 UNITS CAPS Take 50,000 Units by mouth every  30 (thirty) days. Take on the 14th     No current facility-administered medications on file prior to visit.     No Known Allergies  Family History  Problem Relation Age of Onset  . Diabetes Sister     BP 118/78 (BP Location: Right Arm, Patient Position: Sitting, Cuff Size: Normal)   Pulse 65   Ht 5\' 9"  (1.753 m)   Wt 209 lb 6.4 oz (95 kg)   SpO2 95%   BMI 30.92 kg/m    Review of Systems Denies LOC    Objective:   Physical Exam VITAL SIGNS:  See vs page GENERAL: no distress Pulses: dorsalis pedis intact bilat.   MSK: no deformity of the feet CV: no leg edema Skin:  no ulcer on the feet.  normal color and temp on the feet. Neuro: sensation is intact to touch on the feet  Lab Results  Component Value Date   HGBA1C 6.6 (A) 04/13/2018       Assessment & Plan:  Type 2 DM: overcontrolled, given this regimen, which does match insulin to his changing needs throughout the day. Hypoglycemia: this is limiting aggressiveness of  glycemic control Low functional status: he is not a candidate for multiple daily injections.   Patient Instructions  check your blood sugar twice a day.  vary the time of day when you check, between before the 3 meals, and at bedtime.  also check if you have symptoms of your blood sugar being too high or too low.  please keep a record of the readings and bring it to your next appointment here (or you can bring the meter itself).  You can write it on any piece of paper.  please call us sooner if your blood sugar goes below 70, or if most readings are over 200. Please reduce the insulin to 20 units with breakfast, and 13 units with supper On this type of insulin schedule, you should eat meals on a regular schedule.  If a meal is missed or significantly delayed, your blood sugar could go low. Please come back for a follow-up appointment in 3 months.

## 2018-04-25 DIAGNOSIS — E782 Mixed hyperlipidemia: Secondary | ICD-10-CM | POA: Diagnosis not present

## 2018-04-25 DIAGNOSIS — Q909 Down syndrome, unspecified: Secondary | ICD-10-CM | POA: Diagnosis not present

## 2018-04-25 DIAGNOSIS — I10 Essential (primary) hypertension: Secondary | ICD-10-CM | POA: Diagnosis not present

## 2018-06-01 DIAGNOSIS — E782 Mixed hyperlipidemia: Secondary | ICD-10-CM | POA: Diagnosis not present

## 2018-06-01 DIAGNOSIS — Q909 Down syndrome, unspecified: Secondary | ICD-10-CM | POA: Diagnosis not present

## 2018-06-01 DIAGNOSIS — E119 Type 2 diabetes mellitus without complications: Secondary | ICD-10-CM | POA: Diagnosis not present

## 2018-06-01 DIAGNOSIS — Z6833 Body mass index (BMI) 33.0-33.9, adult: Secondary | ICD-10-CM | POA: Diagnosis not present

## 2018-10-26 DIAGNOSIS — E559 Vitamin D deficiency, unspecified: Secondary | ICD-10-CM | POA: Diagnosis not present

## 2018-10-26 DIAGNOSIS — R770 Abnormality of albumin: Secondary | ICD-10-CM | POA: Diagnosis not present

## 2018-10-26 DIAGNOSIS — E039 Hypothyroidism, unspecified: Secondary | ICD-10-CM | POA: Diagnosis not present

## 2018-10-26 DIAGNOSIS — R7303 Prediabetes: Secondary | ICD-10-CM | POA: Diagnosis not present

## 2018-10-26 DIAGNOSIS — R6889 Other general symptoms and signs: Secondary | ICD-10-CM | POA: Diagnosis not present

## 2018-10-26 DIAGNOSIS — E785 Hyperlipidemia, unspecified: Secondary | ICD-10-CM | POA: Diagnosis not present

## 2018-11-09 DIAGNOSIS — Z6832 Body mass index (BMI) 32.0-32.9, adult: Secondary | ICD-10-CM | POA: Diagnosis not present

## 2018-11-09 DIAGNOSIS — I10 Essential (primary) hypertension: Secondary | ICD-10-CM | POA: Diagnosis not present

## 2018-11-09 DIAGNOSIS — Q909 Down syndrome, unspecified: Secondary | ICD-10-CM | POA: Diagnosis not present

## 2018-11-09 DIAGNOSIS — E119 Type 2 diabetes mellitus without complications: Secondary | ICD-10-CM | POA: Diagnosis not present

## 2018-11-28 DIAGNOSIS — I10 Essential (primary) hypertension: Secondary | ICD-10-CM | POA: Diagnosis not present

## 2018-11-28 DIAGNOSIS — E119 Type 2 diabetes mellitus without complications: Secondary | ICD-10-CM | POA: Diagnosis not present

## 2018-11-28 DIAGNOSIS — Q909 Down syndrome, unspecified: Secondary | ICD-10-CM | POA: Diagnosis not present

## 2018-11-28 DIAGNOSIS — Z6832 Body mass index (BMI) 32.0-32.9, adult: Secondary | ICD-10-CM | POA: Diagnosis not present

## 2018-12-14 DIAGNOSIS — I10 Essential (primary) hypertension: Secondary | ICD-10-CM | POA: Diagnosis not present

## 2018-12-14 DIAGNOSIS — Z6832 Body mass index (BMI) 32.0-32.9, adult: Secondary | ICD-10-CM | POA: Diagnosis not present

## 2018-12-14 DIAGNOSIS — Q909 Down syndrome, unspecified: Secondary | ICD-10-CM | POA: Diagnosis not present

## 2018-12-14 DIAGNOSIS — K5901 Slow transit constipation: Secondary | ICD-10-CM | POA: Diagnosis not present

## 2019-02-15 DIAGNOSIS — E119 Type 2 diabetes mellitus without complications: Secondary | ICD-10-CM | POA: Diagnosis not present

## 2019-02-15 DIAGNOSIS — I10 Essential (primary) hypertension: Secondary | ICD-10-CM | POA: Diagnosis not present

## 2019-02-15 DIAGNOSIS — Z6832 Body mass index (BMI) 32.0-32.9, adult: Secondary | ICD-10-CM | POA: Diagnosis not present

## 2019-02-15 DIAGNOSIS — Q909 Down syndrome, unspecified: Secondary | ICD-10-CM | POA: Diagnosis not present

## 2019-03-01 DIAGNOSIS — L309 Dermatitis, unspecified: Secondary | ICD-10-CM | POA: Diagnosis not present

## 2019-03-01 DIAGNOSIS — K5901 Slow transit constipation: Secondary | ICD-10-CM | POA: Diagnosis not present

## 2019-03-01 DIAGNOSIS — Z6832 Body mass index (BMI) 32.0-32.9, adult: Secondary | ICD-10-CM | POA: Diagnosis not present

## 2019-03-01 DIAGNOSIS — E119 Type 2 diabetes mellitus without complications: Secondary | ICD-10-CM | POA: Diagnosis not present

## 2019-03-01 DIAGNOSIS — I10 Essential (primary) hypertension: Secondary | ICD-10-CM | POA: Diagnosis not present

## 2019-04-20 ENCOUNTER — Ambulatory Visit (INDEPENDENT_AMBULATORY_CARE_PROVIDER_SITE_OTHER): Payer: Medicare Other | Admitting: Endocrinology

## 2019-04-20 ENCOUNTER — Encounter: Payer: Self-pay | Admitting: Endocrinology

## 2019-04-20 ENCOUNTER — Telehealth: Payer: Self-pay

## 2019-04-20 ENCOUNTER — Other Ambulatory Visit: Payer: Self-pay

## 2019-04-20 ENCOUNTER — Telehealth: Payer: Self-pay | Admitting: Endocrinology

## 2019-04-20 VITALS — BP 104/60 | HR 89 | Ht 69.0 in | Wt 207.2 lb

## 2019-04-20 DIAGNOSIS — Z794 Long term (current) use of insulin: Secondary | ICD-10-CM | POA: Diagnosis not present

## 2019-04-20 DIAGNOSIS — E119 Type 2 diabetes mellitus without complications: Secondary | ICD-10-CM

## 2019-04-20 LAB — POCT GLYCOSYLATED HEMOGLOBIN (HGB A1C): Hemoglobin A1C: 8.7 % — AB (ref 4.0–5.6)

## 2019-04-20 MED ORDER — INSULIN LISPRO PROT & LISPRO (75-25 MIX) 100 UNIT/ML ~~LOC~~ SUSP: SUBCUTANEOUS | 0 refills | Status: DC

## 2019-04-20 MED ORDER — RYBELSUS 3 MG PO TABS: 3.0000 mg | ORAL_TABLET | ORAL | 11 refills | Status: DC

## 2019-04-20 NOTE — Progress Notes (Signed)
Subjective:    Patient ID: Gregory Kennedy, male    DOB: 1975/07/21, 43 y.o.   MRN: [id number]  HPI Pt returns for f/u of diabetes mellitus: DM type: Insulin-requiring type 2 Dx'ed: [id number] Complications: none Therapy: insulin since 2012 DKA: never Severe hypoglycemia: last episode was 2013 Pancreatitis: never Pancreatic imaging: never Other: he takes BID insulin, due to low functional status; he lives in a group home, where insulin is brought and admisistered to him Interval history: pt is here with caretaker.  He brings a record of his cbg's which I have reviewed today.  cbg varies from 96-291.  There is no trend throughout the day.  pt states he feels well in general.  Past Medical History:  Diagnosis Date  . Diabetes mellitus   . Down syndrome   . Hypertension     No past surgical history on file.  Social History   Socioeconomic History  . Marital status: Single    Spouse name: Not on file  . Number of children: Not on file  . Years of education: Not on file  . Highest education level: Not on file  Occupational History  . Not on file  Tobacco Use  . Smoking status: Never Smoker  . Smokeless tobacco: Never Used  Substance and Sexual Activity  . Alcohol use: No  . Drug use: No  . Sexual activity: Not on file  Other Topics Concern  . Not on file  Social History Narrative  . Not on file   Social Determinants of Health   Financial Resource Strain:   . Difficulty of Paying Living Expenses: Not on file  Food Insecurity:   . Worried About Charity fundraiser in the Last Year: Not on file  . Ran Out of Food in the Last Year: Not on file  Transportation Needs:   . Lack of Transportation (Medical): Not on file  . Lack of Transportation (Non-Medical): Not on file  Physical Activity:   . Days of Exercise per Week: Not on file  . Minutes of Exercise per Session: Not on file  Stress:   . Feeling of Stress : Not on file  Social Connections:   . Frequency of  Communication with Friends and Family: Not on file  . Frequency of Social Gatherings with Friends and Family: Not on file  . Attends Religious Services: Not on file  . Active Member of Clubs or Organizations: Not on file  . Attends Archivist Meetings: Not on file  . Marital Status: Not on file  Intimate Partner Violence:   . Fear of Current or Ex-Partner: Not on file  . Emotionally Abused: Not on file  . Physically Abused: Not on file  . Sexually Abused: Not on file    Current Outpatient Medications on File Prior to Visit  Medication Sig Dispense Refill  . amLODipine (NORVASC) 10 MG tablet Take 10 mg by mouth daily.    Marland Kitchen atorvastatin (LIPITOR) 20 MG tablet Take 20 mg by mouth daily.    Marland Kitchen docusate sodium (COLACE) 100 MG capsule Take 100 mg by mouth 2 (two) times daily.    . enalapril (VASOTEC) 20 MG tablet Take 20 mg by mouth daily.    . hydrochlorothiazide (HYDRODIURIL) 25 MG tablet Take 25 mg by mouth daily.    . metFORMIN (GLUCOPHAGE) 500 MG tablet Take 500 mg by mouth 2 (two) times daily with a meal.    . naltrexone (DEPADE) 50 MG tablet Take 50 mg by  mouth daily.    . TRUE METRIX BLOOD GLUCOSE TEST test strip USE TO TEST 3 TIMES A DAY. DX E10.9  6  . Vitamin D, Ergocalciferol, (DRISDOL) 50000 UNITS CAPS Take 50,000 Units by mouth every 30 (thirty) days. Take on the 14th     No current facility-administered medications on file prior to visit.    No Known Allergies  Family History  Problem Relation Age of Onset  . Diabetes Sister     BP 104/60 (BP Location: Right Arm, Patient Position: Sitting, Cuff Size: Large)   Pulse 89   Ht 5\' 9"  (1.753 m)   Wt 207 lb 3.2 oz (94 kg)   SpO2 95%   BMI 30.60 kg/m    Review of Systems He denies hypoglycemia.     Objective:   Physical Exam VITAL SIGNS:  See vs page GENERAL: no distress Pulses: dorsalis pedis intact bilat.   MSK: no deformity of the feet CV: trace bilat leg edema Skin:  no ulcer on the feet.  normal  color and temp on the feet. Neuro: sensation is intact to touch on the feet  Lab Results  Component Value Date   HGBA1C 8.7 (A) 04/20/2019       Assessment & Plan:  Insulin-requiring type 2 DM: worse.  We can't increase insulin, so we'll try to reduce insulin, and add oral rx.  Low functional status: in this setting, he is not a candidate for aggresive glycemic control.   Patient Instructions  check your blood sugar twice a day.  vary the time of day when you check, between before the 3 meals, and at bedtime.  also check if you have symptoms of your blood sugar being too high or too low.  please keep a record of the readings and bring it to your next appointment here (or you can bring the meter itself).  You can write it on any piece of paper.  please call us sooner if your blood sugar goes below 70, or if most readings are over 200. Please add "Rybelsus," 3 mg each morning, and: Please reduce the insulin to 15 units with breakfast, and 10 units with supper.   On this type of insulin schedule, you should eat meals on a regular schedule.  If a meal is missed or significantly delayed, your blood sugar could go low.  Please fax cbg record next week.  We'll then increase the Rybelsus, decrease the insulin, or both.   Please come back for a follow-up appointment in 2 months.

## 2019-04-20 NOTE — Telephone Encounter (Signed)
Office notes for today's visit 04/20/19 and Rx that was changed

## 2019-04-20 NOTE — Telephone Encounter (Signed)
Patient left MAR from [place] at our office. It has been placed in an envelope and put in the front desk accordion file in the "M" slot

## 2019-04-20 NOTE — Patient Instructions (Addendum)
check your blood sugar twice a day.  vary the time of day when you check, between before the 3 meals, and at bedtime.  also check if you have symptoms of your blood sugar being too high or too low.  please keep a record of the readings and bring it to your next appointment here (or you can bring the meter itself).  You can write it on any piece of paper.  please call us sooner if your blood sugar goes below 70, or if most readings are over 200. Please add "Rybelsus," 3 mg each morning, and: Please reduce the insulin to 15 units with breakfast, and 10 units with supper.   On this type of insulin schedule, you should eat meals on a regular schedule.  If a meal is missed or significantly delayed, your blood sugar could go low.  Please fax cbg record next week.  We'll then increase the Rybelsus, decrease the insulin, or both.   Please come back for a follow-up appointment in 2 months.

## 2019-04-21 ENCOUNTER — Other Ambulatory Visit: Payer: Self-pay

## 2019-04-21 ENCOUNTER — Other Ambulatory Visit: Payer: Self-pay | Admitting: Endocrinology

## 2019-04-21 DIAGNOSIS — E119 Type 2 diabetes mellitus without complications: Secondary | ICD-10-CM | POA: Insufficient documentation

## 2019-04-21 MED ORDER — INSULIN LISPRO PROT & LISPRO (75-25 MIX) 100 UNIT/ML ~~LOC~~ SUSP: SUBCUTANEOUS | 0 refills | Status: DC

## 2019-04-21 MED ORDER — RYBELSUS 3 MG PO TABS: 3.0000 mg | ORAL_TABLET | ORAL | 11 refills | Status: DC

## 2019-04-21 MED ORDER — NOVOLOG MIX 70/30 FLEXPEN (70-30) 100 UNIT/ML ~~LOC~~ SUPN: PEN_INJECTOR | SUBCUTANEOUS | 11 refills | Status: DC

## 2019-04-21 NOTE — Telephone Encounter (Signed)
Please let me know once you have completed your note so this and the Rx can be faxed to [facility]

## 2019-04-21 NOTE — Telephone Encounter (Signed)
done

## 2019-04-21 NOTE — Telephone Encounter (Signed)
Chart notes printed and faxed to pt's [facility].

## 2019-06-14 DIAGNOSIS — Q909 Down syndrome, unspecified: Secondary | ICD-10-CM | POA: Diagnosis not present

## 2019-06-14 DIAGNOSIS — E119 Type 2 diabetes mellitus without complications: Secondary | ICD-10-CM | POA: Diagnosis not present

## 2019-06-14 DIAGNOSIS — I10 Essential (primary) hypertension: Secondary | ICD-10-CM | POA: Diagnosis not present

## 2019-06-14 DIAGNOSIS — Z6832 Body mass index (BMI) 32.0-32.9, adult: Secondary | ICD-10-CM | POA: Diagnosis not present

## 2019-06-19 DIAGNOSIS — Z23 Encounter for immunization: Secondary | ICD-10-CM | POA: Diagnosis not present

## 2019-06-22 ENCOUNTER — Encounter: Payer: Self-pay | Admitting: Endocrinology

## 2019-06-22 ENCOUNTER — Ambulatory Visit (INDEPENDENT_AMBULATORY_CARE_PROVIDER_SITE_OTHER): Payer: Medicare Other | Admitting: Endocrinology

## 2019-06-22 ENCOUNTER — Other Ambulatory Visit: Payer: Self-pay

## 2019-06-22 VITALS — BP 100/70 | HR 100 | Ht 69.0 in | Wt 204.4 lb

## 2019-06-22 DIAGNOSIS — E119 Type 2 diabetes mellitus without complications: Secondary | ICD-10-CM | POA: Diagnosis not present

## 2019-06-22 DIAGNOSIS — Z794 Long term (current) use of insulin: Secondary | ICD-10-CM

## 2019-06-22 LAB — POCT GLYCOSYLATED HEMOGLOBIN (HGB A1C): Hemoglobin A1C: 9.8 % — AB (ref 4.0–5.6)

## 2019-06-22 MED ORDER — RYBELSUS 7 MG PO TABS: 7.0000 mg | ORAL_TABLET | ORAL | 11 refills | Status: DC

## 2019-06-22 MED ORDER — NOVOLOG MIX 70/30 FLEXPEN (70-30) 100 UNIT/ML ~~LOC~~ SUPN: PEN_INJECTOR | SUBCUTANEOUS | 11 refills | Status: DC

## 2019-06-22 NOTE — Patient Instructions (Addendum)
check your blood sugar twice a day.  vary the time of day when you check, between before the 3 meals, and at bedtime.  also check if you have symptoms of your blood sugar being too high or too low.  please keep a record of the readings and bring it to your next appointment here (or you can bring the meter itself).  You can write it on any piece of paper.  please call us sooner if your blood sugar goes below 70, or if most readings are over 200. Please increase the Rybelsus to 7 mg each morning, and: increase the insulin to 20 units with breakfast, and 10 units with supper.   On this type of insulin schedule, you should eat meals on a regular schedule.  If a meal is missed or significantly delayed, your blood sugar could go low.  Please fax cbg record in 2 weeks.  We'll then increase the Rybelsus, reduce the insulin, or both  Please come back for a follow-up appointment in 2 months.

## 2019-06-22 NOTE — Progress Notes (Signed)
Subjective:    Patient ID: Gregory Kennedy, male    DOB: 11-26-1975, 44 y.o.   MRN: [id number]  HPI Pt returns for f/u of diabetes mellitus: DM type: Insulin-requiring type 2 Dx'ed: [id number] Complications: none Therapy: insulin since 2012 DKA: never Severe hypoglycemia: last episode was 2013 Pancreatitis: never Pancreatic imaging: never.  SDOH: he takes BID insulin, due to low functional status; he lives in a group home, where insulin is brought and administered to him Interval history: pt is here with caretaker.  He brings a record of his cbg's which I have reviewed today.  cbg varies from 116-317.  It is in general higher as the day goes on.  pt states he feels well in general.   Past Medical History:  Diagnosis Date  . Diabetes mellitus   . Down syndrome   . Hypertension     No past surgical history on file.  Social History   Socioeconomic History  . Marital status: Single    Spouse name: Not on file  . Number of children: Not on file  . Years of education: Not on file  . Highest education level: Not on file  Occupational History  . Not on file  Tobacco Use  . Smoking status: Never Smoker  . Smokeless tobacco: Never Used  Substance and Sexual Activity  . Alcohol use: No  . Drug use: No  . Sexual activity: Not on file  Other Topics Concern  . Not on file  Social History Narrative  . Not on file   Social Determinants of Health   Financial Resource Strain:   . Difficulty of Paying Living Expenses: Not on file  Food Insecurity:   . Worried About Charity fundraiser in the Last Year: Not on file  . Ran Out of Food in the Last Year: Not on file  Transportation Needs:   . Lack of Transportation (Medical): Not on file  . Lack of Transportation (Non-Medical): Not on file  Physical Activity:   . Days of Exercise per Week: Not on file  . Minutes of Exercise per Session: Not on file  Stress:   . Feeling of Stress : Not on file  Social Connections:   . Frequency of  Communication with Friends and Family: Not on file  . Frequency of Social Gatherings with Friends and Family: Not on file  . Attends Religious Services: Not on file  . Active Member of Clubs or Organizations: Not on file  . Attends Archivist Meetings: Not on file  . Marital Status: Not on file  Intimate Partner Violence:   . Fear of Current or Ex-Partner: Not on file  . Emotionally Abused: Not on file  . Physically Abused: Not on file  . Sexually Abused: Not on file    Current Outpatient Medications on File Prior to Visit  Medication Sig Dispense Refill  . amLODipine (NORVASC) 10 MG tablet Take 10 mg by mouth daily.    Marland Kitchen atorvastatin (LIPITOR) 20 MG tablet Take 20 mg by mouth daily.    Marland Kitchen docusate sodium (COLACE) 100 MG capsule Take 100 mg by mouth 2 (two) times daily.    . enalapril (VASOTEC) 20 MG tablet Take 20 mg by mouth daily.    . hydrochlorothiazide (HYDRODIURIL) 25 MG tablet Take 25 mg by mouth daily.    . metFORMIN (GLUCOPHAGE) 500 MG tablet Take 500 mg by mouth 2 (two) times daily with a meal.    . naltrexone (DEPADE) 50 MG  tablet Take 50 mg by mouth daily.    . TRUE METRIX BLOOD GLUCOSE TEST test strip USE TO TEST 3 TIMES A DAY. DX E10.9  6  . Vitamin D, Ergocalciferol, (DRISDOL) 50000 UNITS CAPS Take 50,000 Units by mouth every 30 (thirty) days. Take on the 14th     No current facility-administered medications on file prior to visit.    No Known Allergies  Family History  Problem Relation Age of Onset  . Diabetes Sister     BP 100/70 (BP Location: Left Arm, Patient Position: Sitting, Cuff Size: Normal)   Pulse 100   Ht 5\' 9"  (1.753 m)   Wt 204 lb 6.4 oz (92.7 kg)   SpO2 93%   BMI 30.18 kg/m    Review of Systems He denies hypoglycemia.      Objective:   Physical Exam VITAL SIGNS:  See vs page GENERAL: no distress Pulses: dorsalis pedis intact bilat.   MSK: no deformity of the feet CV: no leg edema Skin:  no ulcer on the feet.  normal color  and temp on the feet.  Neuro: sensation is intact to touch on the feet.   Lab Results  Component Value Date   HGBA1C 9.8 (A) 06/22/2019       Assessment & Plan:  Insulin-requiring type 2 DM: worse SDOH: he is not a candidate for multiple daily injections.    Patient Instructions  check your blood sugar twice a day.  vary the time of day when you check, between before the 3 meals, and at bedtime.  also check if you have symptoms of your blood sugar being too high or too low.  please keep a record of the readings and bring it to your next appointment here (or you can bring the meter itself).  You can write it on any piece of paper.  please call us sooner if your blood sugar goes below 70, or if most readings are over 200. Please increase the Rybelsus to 7 mg each morning, and: increase the insulin to 20 units with breakfast, and 10 units with supper.   On this type of insulin schedule, you should eat meals on a regular schedule.  If a meal is missed or significantly delayed, your blood sugar could go low.  Please fax cbg record in 2 weeks.  We'll then increase the Rybelsus, reduce the insulin, or both  Please come back for a follow-up appointment in 2 months.

## 2019-07-17 DIAGNOSIS — Z23 Encounter for immunization: Secondary | ICD-10-CM | POA: Diagnosis not present

## 2019-08-16 DIAGNOSIS — Z6832 Body mass index (BMI) 32.0-32.9, adult: Secondary | ICD-10-CM | POA: Diagnosis not present

## 2019-08-16 DIAGNOSIS — E119 Type 2 diabetes mellitus without complications: Secondary | ICD-10-CM | POA: Diagnosis not present

## 2019-08-16 DIAGNOSIS — Q909 Down syndrome, unspecified: Secondary | ICD-10-CM | POA: Diagnosis not present

## 2019-08-16 DIAGNOSIS — E782 Mixed hyperlipidemia: Secondary | ICD-10-CM | POA: Diagnosis not present

## 2019-08-16 DIAGNOSIS — E559 Vitamin D deficiency, unspecified: Secondary | ICD-10-CM | POA: Diagnosis not present

## 2019-08-24 ENCOUNTER — Ambulatory Visit (INDEPENDENT_AMBULATORY_CARE_PROVIDER_SITE_OTHER): Payer: Medicare Other | Admitting: Endocrinology

## 2019-08-24 ENCOUNTER — Other Ambulatory Visit: Payer: Self-pay

## 2019-08-24 ENCOUNTER — Encounter: Payer: Self-pay | Admitting: Endocrinology

## 2019-08-24 ENCOUNTER — Telehealth: Payer: Self-pay | Admitting: Endocrinology

## 2019-08-24 VITALS — BP 132/80 | HR 84 | Ht 69.0 in | Wt 209.0 lb

## 2019-08-24 DIAGNOSIS — E119 Type 2 diabetes mellitus without complications: Secondary | ICD-10-CM

## 2019-08-24 DIAGNOSIS — Z794 Long term (current) use of insulin: Secondary | ICD-10-CM | POA: Diagnosis not present

## 2019-08-24 LAB — POCT GLYCOSYLATED HEMOGLOBIN (HGB A1C): Hemoglobin A1C: 9.4 % — AB (ref 4.0–5.6)

## 2019-08-24 MED ORDER — RYBELSUS 14 MG PO TABS: 14.0000 mg | ORAL_TABLET | ORAL | 3 refills | Status: DC

## 2019-08-24 MED ORDER — NOVOLOG MIX 70/30 FLEXPEN (70-30) 100 UNIT/ML ~~LOC~~ SUPN: PEN_INJECTOR | SUBCUTANEOUS | 11 refills | Status: DC

## 2019-08-24 NOTE — Progress Notes (Signed)
Subjective:    Patient ID: Gregory Kennedy, male    DOB: Jun 15, 1975, 44 y.o.   MRN: [id number]  HPI Pt returns for f/u of diabetes mellitus: DM type: Insulin-requiring type 2 Dx'ed: [id number] Complications: none Therapy: insulin since 2012, and Rybelsus.   DKA: never Severe hypoglycemia: last episode was 2013 Pancreatitis: never Pancreatic imaging: never.  SDOH: he takes BID insulin, due to low functional status; he lives in a group home, where insulin is brought and administered to him.  Interval history: pt is here with caretaker.  He brings a record of his cbg's which I have reviewed today.  cbg varies from 116-317.  It is in general higher as the day goes on.  pt states he feels well in general.   Past Medical History:  Diagnosis Date  . Diabetes mellitus   . Down syndrome   . Hypertension     No past surgical history on file.  Social History   Socioeconomic History  . Marital status: Single    Spouse name: Not on file  . Number of children: Not on file  . Years of education: Not on file  . Highest education level: Not on file  Occupational History  . Not on file  Tobacco Use  . Smoking status: Never Smoker  . Smokeless tobacco: Never Used  Substance and Sexual Activity  . Alcohol use: No  . Drug use: No  . Sexual activity: Not on file  Other Topics Concern  . Not on file  Social History Narrative  . Not on file   Social Determinants of Health   Financial Resource Strain:   . Difficulty of Paying Living Expenses:   Food Insecurity:   . Worried About Charity fundraiser in the Last Year:   . Arboriculturist in the Last Year:   Transportation Needs:   . Film/video editor (Medical):   Marland Kitchen Lack of Transportation (Non-Medical):   Physical Activity:   . Days of Exercise per Week:   . Minutes of Exercise per Session:   Stress:   . Feeling of Stress :   Social Connections:   . Frequency of Communication with Friends and Family:   . Frequency of Social  Gatherings with Friends and Family:   . Attends Religious Services:   . Active Member of Clubs or Organizations:   . Attends Archivist Meetings:   Marland Kitchen Marital Status:   Intimate Partner Violence:   . Fear of Current or Ex-Partner:   . Emotionally Abused:   Marland Kitchen Physically Abused:   . Sexually Abused:     Current Outpatient Medications on File Prior to Visit  Medication Sig Dispense Refill  . amLODipine (NORVASC) 10 MG tablet Take 10 mg by mouth daily.    Marland Kitchen atorvastatin (LIPITOR) 20 MG tablet Take 20 mg by mouth daily.    Marland Kitchen docusate sodium (COLACE) 100 MG capsule Take 100 mg by mouth 2 (two) times daily.    . enalapril (VASOTEC) 20 MG tablet Take 20 mg by mouth daily.    . hydrochlorothiazide (HYDRODIURIL) 25 MG tablet Take 25 mg by mouth daily.    . metFORMIN (GLUCOPHAGE) 500 MG tablet Take 500 mg by mouth 2 (two) times daily with a meal.    . naltrexone (DEPADE) 50 MG tablet Take 50 mg by mouth daily.    . TRUE METRIX BLOOD GLUCOSE TEST test strip USE TO TEST 3 TIMES A DAY. DX E10.9  6  .  Vitamin D, Ergocalciferol, (DRISDOL) 50000 UNITS CAPS Take 50,000 Units by mouth every 30 (thirty) days. Take on the 14th     No current facility-administered medications on file prior to visit.    No Known Allergies  Family History  Problem Relation Age of Onset  . Diabetes Sister     BP 132/80   Pulse 84   Ht 5\' 9"  (1.753 m)   Wt 209 lb (94.8 kg)   SpO2 93%   BMI 30.86 kg/m    Review of Systems Denies nausea.     Objective:   Physical Exam VITAL SIGNS:  See vs page GENERAL: no distress Pulses: dorsalis pedis intact bilat.   MSK: no deformity of the feet CV: no leg edema Skin:  no ulcer on the feet.  normal color and temp on the feet. Neuro: sensation is intact to touch on the feet    Lab Results  Component Value Date   HGBA1C 9.4 (A) 08/24/2019       Assessment & Plan:  Insulin-requiring type 2 DM: he needs increased rx SDOH: Hypoglycemia: this limits  aggressiveness of glycemic control  Patient Instructions  check your blood sugar twice a day.  vary the time of day when you check, between before the 3 meals, and at bedtime.  also check if you have symptoms of your blood sugar being too high or too low.  please keep a record of the readings and bring it to your next appointment here (or you can bring the meter itself).  You can write it on any piece of paper.  please call us sooner if your blood sugar goes below 70, or if most readings are over 200. Please increase the Rybelsus to 14 mg each morning.  We are requesting blood sugar record.  When we receive it, we'll also adjust the insulin.   On this type of insulin schedule, you should eat meals on a regular schedule.  If a meal is missed or significantly delayed, your blood sugar could go low.   Please come back for a follow-up appointment in 2 months.

## 2019-08-24 NOTE — Telephone Encounter (Signed)
Chart Review Routing History Since 08/25/2018 [facility] Full Routing History)  Recipients Sent On Sent By Routed Reports   [facility]     07/06/2019 11:46 AM Aleatha Borer, LPN Office Visit on [id number] with Renato Shin, MD       Letter written with new orders and faxed to [facility] as indicated above. Copy of letter can be located in Epic for future reference.

## 2019-08-24 NOTE — Telephone Encounter (Signed)
please contact patient's facility Please increase the insulin to 24 units with breakfast, and 10 units with supper. Please come back for a follow-up appointment in 2 months.

## 2019-08-24 NOTE — Patient Instructions (Addendum)
check your blood sugar twice a day.  vary the time of day when you check, between before the 3 meals, and at bedtime.  also check if you have symptoms of your blood sugar being too high or too low.  please keep a record of the readings and bring it to your next appointment here (or you can bring the meter itself).  You can write it on any piece of paper.  please call us sooner if your blood sugar goes below 70, or if most readings are over 200. Please increase the Rybelsus to 14 mg each morning.  We are requesting blood sugar record.  When we receive it, we'll also adjust the insulin.   On this type of insulin schedule, you should eat meals on a regular schedule.  If a meal is missed or significantly delayed, your blood sugar could go low.   Please come back for a follow-up appointment in 2 months.

## 2019-08-30 DIAGNOSIS — Q909 Down syndrome, unspecified: Secondary | ICD-10-CM | POA: Diagnosis not present

## 2019-08-30 DIAGNOSIS — E559 Vitamin D deficiency, unspecified: Secondary | ICD-10-CM | POA: Diagnosis not present

## 2019-08-30 DIAGNOSIS — E119 Type 2 diabetes mellitus without complications: Secondary | ICD-10-CM | POA: Diagnosis not present

## 2019-09-13 DIAGNOSIS — E559 Vitamin D deficiency, unspecified: Secondary | ICD-10-CM | POA: Diagnosis not present

## 2019-09-13 DIAGNOSIS — E119 Type 2 diabetes mellitus without complications: Secondary | ICD-10-CM | POA: Diagnosis not present

## 2019-09-13 DIAGNOSIS — Q909 Down syndrome, unspecified: Secondary | ICD-10-CM | POA: Diagnosis not present

## 2019-10-18 DIAGNOSIS — R6889 Other general symptoms and signs: Secondary | ICD-10-CM | POA: Diagnosis not present

## 2019-10-18 DIAGNOSIS — E039 Hypothyroidism, unspecified: Secondary | ICD-10-CM | POA: Diagnosis not present

## 2019-10-18 DIAGNOSIS — E559 Vitamin D deficiency, unspecified: Secondary | ICD-10-CM | POA: Diagnosis not present

## 2019-10-18 DIAGNOSIS — E785 Hyperlipidemia, unspecified: Secondary | ICD-10-CM | POA: Diagnosis not present

## 2019-10-18 DIAGNOSIS — R7303 Prediabetes: Secondary | ICD-10-CM | POA: Diagnosis not present

## 2019-10-31 ENCOUNTER — Encounter: Payer: Self-pay | Admitting: Endocrinology

## 2019-10-31 ENCOUNTER — Ambulatory Visit (INDEPENDENT_AMBULATORY_CARE_PROVIDER_SITE_OTHER): Payer: Medicare Other | Admitting: Endocrinology

## 2019-10-31 ENCOUNTER — Other Ambulatory Visit: Payer: Self-pay

## 2019-10-31 VITALS — BP 136/88 | HR 81 | Ht 69.0 in | Wt 205.8 lb

## 2019-10-31 DIAGNOSIS — E119 Type 2 diabetes mellitus without complications: Secondary | ICD-10-CM

## 2019-10-31 DIAGNOSIS — Z794 Long term (current) use of insulin: Secondary | ICD-10-CM | POA: Diagnosis not present

## 2019-10-31 LAB — BASIC METABOLIC PANEL
BUN: 14 mg/dL (ref 6–23)
CO2: 33 mEq/L — ABNORMAL HIGH (ref 19–32)
Calcium: 9.8 mg/dL (ref 8.4–10.5)
Chloride: 101 mEq/L (ref 96–112)
Creatinine, Ser: 1.03 mg/dL (ref 0.40–1.50)
GFR: 94.95 mL/min (ref >60.00)
Glucose, Bld: 167 mg/dL — ABNORMAL HIGH (ref 70–99)
Potassium: 3.8 mEq/L (ref 3.5–5.1)
Sodium: 138 mEq/L (ref 135–145)

## 2019-10-31 LAB — POCT GLYCOSYLATED HEMOGLOBIN (HGB A1C): Hemoglobin A1C: 8.4 % — AB (ref 4.0–5.6)

## 2019-10-31 LAB — TSH: TSH: 0.64 u[IU]/mL (ref 0.35–4.50)

## 2019-10-31 MED ORDER — DAPAGLIFLOZIN PROPANEDIOL 5 MG PO TABS: 5.0000 mg | ORAL_TABLET | Freq: Every day | ORAL | 3 refills | Status: DC

## 2019-10-31 NOTE — Progress Notes (Signed)
Subjective:    Patient ID: Gregory Kennedy, male    DOB: 12-Jun-1975, 44 y.o.   MRN: [id number]  HPI Pt returns for f/u of diabetes mellitus: DM type: Insulin-requiring type 2 Dx'ed: 2229 Complications: none Therapy: insulin since 2012, and 2 oral meds.   DKA: never Severe hypoglycemia: last episode was 2013 Pancreatitis: never Pancreatic imaging: never.  SDOH: he takes BID insulin, due to low functional status; he lives in a group home, where insulin is brought and administered to him.  Interval history: pt is here with caretaker.  He brings a record of his cbg's which I have reviewed today.  cbg varies from 86-236.  It is in general higher as the day goes on, but not necessarily so.  pt states he feels well in general.   Past Medical History:  Diagnosis Date   Diabetes mellitus    Down syndrome    Hypertension     No past surgical history on file.  Social History   Socioeconomic History   Marital status: Single    Spouse name: Not on file   Number of children: Not on file   Years of education: Not on file   Highest education level: Not on file  Occupational History   Not on file  Tobacco Use   Smoking status: Never Smoker   Smokeless tobacco: Never Used  Substance and Sexual Activity   Alcohol use: No   Drug use: No   Sexual activity: Not on file  Other Topics Concern   Not on file  Social History Narrative   Not on file   Social Determinants of Health   Financial Resource Strain:    Difficulty of Paying Living Expenses:   Food Insecurity:    Worried About [place] in the Last Year:    Arboriculturist in the Last Year:   Transportation Needs:    Film/video editor (Medical):    Lack of Transportation (Non-Medical):   Physical Activity:    Days of Exercise per Week:    Minutes of Exercise per Session:   Stress:    Feeling of Stress :   Social Connections:    Frequency of Communication with Friends and Family:     Frequency of Social Gatherings with Friends and Family:    Attends Religious Services:    Active Member of Clubs or Organizations:    Attends Music therapist:    Marital Status:   Intimate Partner Violence:    Fear of Current or Ex-Partner:    Emotionally Abused:    Physically Abused:    Sexually Abused:     Current Outpatient Medications on File Prior to Visit  Medication Sig Dispense Refill   amLODipine (NORVASC) 10 MG tablet Take 10 mg by mouth daily.     atorvastatin (LIPITOR) 20 MG tablet Take 20 mg by mouth daily.     docusate sodium (COLACE) 100 MG capsule Take 100 mg by mouth 2 (two) times daily.     enalapril (VASOTEC) 20 MG tablet Take 20 mg by mouth daily.     hydrochlorothiazide (HYDRODIURIL) 25 MG tablet Take 25 mg by mouth daily.     insulin aspart protamine - aspart (NOVOLOG MIX 70/30 FLEXPEN) (70-30) 100 UNIT/ML FlexPen 24 units with breakfast, and 10 units with supper. 15 mL 11   metFORMIN (GLUCOPHAGE) 500 MG tablet Take 500 mg by mouth 2 (two) times daily with a meal.     naltrexone (DEPADE)  50 MG tablet Take 50 mg by mouth daily.     Semaglutide (RYBELSUS) 14 MG TABS Take 14 mg by mouth every morning. 90 tablet 3   TRUE METRIX BLOOD GLUCOSE TEST test strip USE TO TEST 3 TIMES A DAY. DX E10.9  6   Vitamin D, Ergocalciferol, (DRISDOL) 50000 UNITS CAPS Take 50,000 Units by mouth every 30 (thirty) days. Take on the 14th     No current facility-administered medications on file prior to visit.    No Known Allergies  Family History  Problem Relation Age of Onset   Diabetes Sister     BP 136/88    Pulse 81    Ht 5\' 9"  (1.753 m)    Wt 205 lb 12.8 oz (93.4 kg)    SpO2 93%    BMI 30.39 kg/m    Review of Systems He denies hypoglycemia.     Objective:   Physical Exam VITAL SIGNS:  See vs page GENERAL: no distress Pulses: dorsalis pedis intact bilat.   MSK: no deformity of the feet CV: trace bilat leg edema Skin:  no ulcer on  the feet.  normal color and temp on the feet. Neuro: sensation is intact to touch on the feet  Lab Results  Component Value Date   HGBA1C 8.4 (A) 10/31/2019    Lab Results  Component Value Date   CREATININE 1.03 10/31/2019   BUN 14 10/31/2019   NA 138 10/31/2019   K 3.8 10/31/2019   CL 101 10/31/2019   CO2 33 (H) 10/31/2019       Assessment & Plan:  Insulin-requiring type 2 DM.  He needs increased rx. elev BUN, new: reduction of HCTZ could be considered, in view of addition of [place].  However, I'll leave this to Dr Bethel Born.  Recheck BMET next time.  Edema: This limits rx options  Patient Instructions  check your blood sugar twice a day.  vary the time of day when you check, between before the 3 meals, and at bedtime.  also check if you have symptoms of your blood sugar being too high or too low.  please keep a record of the readings and bring it to your next appointment here (or you can bring the meter itself).  You can write it on any piece of paper.  please call us sooner if your blood sugar goes below 70, or if most readings are over 200. I have sent a prescription to your pharmacy, to add "Wilder Glade." Please continue the same other diabetes medications. Blood tests are requested for you today.  We'll let you know about the results.  On this type of insulin schedule, you should eat meals on a regular schedule.  If a meal is missed or significantly delayed, your blood sugar could go low.   Please come back for a follow-up appointment in 2 months.

## 2019-10-31 NOTE — Patient Instructions (Addendum)
check your blood sugar twice a day.  vary the time of day when you check, between before the 3 meals, and at bedtime.  also check if you have symptoms of your blood sugar being too high or too low.  please keep a record of the readings and bring it to your next appointment here (or you can bring the meter itself).  You can write it on any piece of paper.  please call us sooner if your blood sugar goes below 70, or if most readings are over 200. I have sent a prescription to your pharmacy, to add "Wilder Glade." Please continue the same other diabetes medications. Blood tests are requested for you today.  We'll let you know about the results.  On this type of insulin schedule, you should eat meals on a regular schedule.  If a meal is missed or significantly delayed, your blood sugar could go low.   Please come back for a follow-up appointment in 2 months.

## 2019-12-01 DIAGNOSIS — E119 Type 2 diabetes mellitus without complications: Secondary | ICD-10-CM | POA: Diagnosis not present

## 2019-12-01 DIAGNOSIS — E785 Hyperlipidemia, unspecified: Secondary | ICD-10-CM | POA: Diagnosis not present

## 2019-12-01 DIAGNOSIS — I10 Essential (primary) hypertension: Secondary | ICD-10-CM | POA: Diagnosis not present

## 2019-12-01 DIAGNOSIS — Q909 Down syndrome, unspecified: Secondary | ICD-10-CM | POA: Diagnosis not present

## 2019-12-12 DIAGNOSIS — H5210 Myopia, unspecified eye: Secondary | ICD-10-CM | POA: Diagnosis not present

## 2019-12-12 DIAGNOSIS — H2513 Age-related nuclear cataract, bilateral: Secondary | ICD-10-CM | POA: Diagnosis not present

## 2019-12-12 DIAGNOSIS — E119 Type 2 diabetes mellitus without complications: Secondary | ICD-10-CM | POA: Diagnosis not present

## 2019-12-12 DIAGNOSIS — H35033 Hypertensive retinopathy, bilateral: Secondary | ICD-10-CM | POA: Diagnosis not present

## 2020-01-02 ENCOUNTER — Other Ambulatory Visit: Payer: Self-pay

## 2020-01-02 ENCOUNTER — Ambulatory Visit (INDEPENDENT_AMBULATORY_CARE_PROVIDER_SITE_OTHER): Payer: Medicare Other | Admitting: Endocrinology

## 2020-01-02 ENCOUNTER — Encounter: Payer: Self-pay | Admitting: Endocrinology

## 2020-01-02 VITALS — BP 124/84 | HR 77 | Ht 69.0 in | Wt 198.4 lb

## 2020-01-02 DIAGNOSIS — Z794 Long term (current) use of insulin: Secondary | ICD-10-CM | POA: Diagnosis not present

## 2020-01-02 DIAGNOSIS — E119 Type 2 diabetes mellitus without complications: Secondary | ICD-10-CM

## 2020-01-02 LAB — POCT GLYCOSYLATED HEMOGLOBIN (HGB A1C): Hemoglobin A1C: 7.2 % — AB (ref 4.0–5.6)

## 2020-01-02 NOTE — Patient Instructions (Addendum)
check your blood sugar twice a day.  vary the time of day when you check, between before the 3 meals, and at bedtime.  also check if you have symptoms of your blood sugar being too high or too low.  please keep a record of the readings and bring it to your next appointment here (or you can bring the meter itself).  You can write it on any piece of paper.  please call us sooner if your blood sugar goes below 70, or if most readings are over 200.  Please continue the same diabetes medications. On this type of insulin schedule, you should eat meals on a regular schedule.  If a meal is missed or significantly delayed, your blood sugar could go low.   Please come back for a follow-up appointment in 3 months.

## 2020-01-02 NOTE — Progress Notes (Signed)
Subjective:    Patient ID: Gregory Kennedy, male    DOB: 1975-06-21, 44 y.o.   MRN: [id number]  HPI Pt returns for f/u of diabetes mellitus: DM type: Insulin-requiring type 2 Dx'ed: 7262 Complications: none Therapy: insulin since 2012, and 3 oral meds.   DKA: never Severe hypoglycemia: last episode was 2013 Pancreatitis: never Pancreatic imaging: never.  SDOH: he takes BID insulin, due to low functional status; he lives in a group home, where insulin is brought and administered to him; staff also checks cbg's Interval history: pt is here with caretaker.  He brings a record of his cbg's which I have reviewed today.  cbg varies from 96-271.  There is no trend throughout the day.  pt states he feels well in general.   Past Medical History:  Diagnosis Date  . Diabetes mellitus   . Down syndrome   . Hypertension     No past surgical history on file.  Social History   Socioeconomic History  . Marital status: Single    Spouse name: Not on file  . Number of children: Not on file  . Years of education: Not on file  . Highest education level: Not on file  Occupational History  . Not on file  Tobacco Use  . Smoking status: Never Smoker  . Smokeless tobacco: Never Used  Substance and Sexual Activity  . Alcohol use: No  . Drug use: No  . Sexual activity: Not on file  Other Topics Concern  . Not on file  Social History Narrative  . Not on file   Social Determinants of Health   Financial Resource Strain:   . Difficulty of Paying Living Expenses: Not on file  Food Insecurity:   . Worried About Charity fundraiser in the Last Year: Not on file  . Ran Out of Food in the Last Year: Not on file  Transportation Needs:   . Lack of Transportation (Medical): Not on file  . Lack of Transportation (Non-Medical): Not on file  Physical Activity:   . Days of Exercise per Week: Not on file  . Minutes of Exercise per Session: Not on file  Stress:   . Feeling of Stress : Not on file    Social Connections:   . Frequency of Communication with Friends and Family: Not on file  . Frequency of Social Gatherings with Friends and Family: Not on file  . Attends Religious Services: Not on file  . Active Member of Clubs or Organizations: Not on file  . Attends Archivist Meetings: Not on file  . Marital Status: Not on file  Intimate Partner Violence:   . Fear of Current or Ex-Partner: Not on file  . Emotionally Abused: Not on file  . Physically Abused: Not on file  . Sexually Abused: Not on file    Current Outpatient Medications on File Prior to Visit  Medication Sig Dispense Refill  . amLODipine (NORVASC) 10 MG tablet Take 10 mg by mouth daily.    Marland Kitchen atorvastatin (LIPITOR) 20 MG tablet Take 20 mg by mouth daily.    . dapagliflozin propanediol (FARXIGA) 5 MG TABS tablet Take 1 tablet (5 mg total) by mouth daily before breakfast. 90 tablet 3  . docusate sodium (COLACE) 100 MG capsule Take 100 mg by mouth 2 (two) times daily.    . enalapril (VASOTEC) 20 MG tablet Take 20 mg by mouth daily.    . hydrochlorothiazide (HYDRODIURIL) 25 MG tablet Take 25 mg by  mouth daily.    . insulin aspart protamine - aspart (NOVOLOG MIX 70/30 FLEXPEN) (70-30) 100 UNIT/ML FlexPen 24 units with breakfast, and 10 units with supper. 15 mL 11  . metFORMIN (GLUCOPHAGE) 500 MG tablet Take 500 mg by mouth 2 (two) times daily with a meal.    . naltrexone (DEPADE) 50 MG tablet Take 50 mg by mouth daily.    . Semaglutide (RYBELSUS) 14 MG TABS Take 14 mg by mouth every morning. 90 tablet 3  . TRUE METRIX BLOOD GLUCOSE TEST test strip USE TO TEST 3 TIMES A DAY. DX E10.9  6  . Vitamin D, Ergocalciferol, (DRISDOL) 50000 UNITS CAPS Take 50,000 Units by mouth every 30 (thirty) days. Take on the 14th     No current facility-administered medications on file prior to visit.    No Known Allergies  Family History  Problem Relation Age of Onset  . Diabetes Sister     BP 124/84   Pulse 77   Ht 5\' 9"   (1.753 m)   Wt 198 lb 6.4 oz (90 kg)   SpO2 98%   BMI 29.30 kg/m    Review of Systems He denies hypoglycemia.      Objective:   Physical Exam VITAL SIGNS:  See vs page GENERAL: no distress Pulses: dorsalis pedis intact bilat.   MSK: no deformity of the feet CV: no leg edema Skin:  no ulcer on the feet, but the skin is dry.  normal color and temp on the feet. Neuro: sensation is intact to touch on the feet  Lab Results  Component Value Date   HGBA1C 7.2 (A) 01/02/2020       Assessment & Plan:  Insulin-requiring type 2 DM: this is the best control this pt should aim for, given this regimen, which does match insulin to his changing needs throughout the day   Patient Instructions  check your blood sugar twice a day.  vary the time of day when you check, between before the 3 meals, and at bedtime.  also check if you have symptoms of your blood sugar being too high or too low.  please keep a record of the readings and bring it to your next appointment here (or you can bring the meter itself).  You can write it on any piece of paper.  please call us sooner if your blood sugar goes below 70, or if most readings are over 200.  Please continue the same diabetes medications. On this type of insulin schedule, you should eat meals on a regular schedule.  If a meal is missed or significantly delayed, your blood sugar could go low.   Please come back for a follow-up appointment in 3 months.

## 2020-01-24 DIAGNOSIS — Z6832 Body mass index (BMI) 32.0-32.9, adult: Secondary | ICD-10-CM | POA: Diagnosis not present

## 2020-01-24 DIAGNOSIS — Q909 Down syndrome, unspecified: Secondary | ICD-10-CM | POA: Diagnosis not present

## 2020-01-24 DIAGNOSIS — I10 Essential (primary) hypertension: Secondary | ICD-10-CM | POA: Diagnosis not present

## 2020-01-24 DIAGNOSIS — E109 Type 1 diabetes mellitus without complications: Secondary | ICD-10-CM | POA: Diagnosis not present

## 2020-02-28 DIAGNOSIS — I10 Essential (primary) hypertension: Secondary | ICD-10-CM | POA: Diagnosis not present

## 2020-02-28 DIAGNOSIS — Z6832 Body mass index (BMI) 32.0-32.9, adult: Secondary | ICD-10-CM | POA: Diagnosis not present

## 2020-02-28 DIAGNOSIS — Q909 Down syndrome, unspecified: Secondary | ICD-10-CM | POA: Diagnosis not present

## 2020-02-28 DIAGNOSIS — E109 Type 1 diabetes mellitus without complications: Secondary | ICD-10-CM | POA: Diagnosis not present

## 2020-03-13 DIAGNOSIS — Q909 Down syndrome, unspecified: Secondary | ICD-10-CM | POA: Diagnosis not present

## 2020-03-13 DIAGNOSIS — E109 Type 1 diabetes mellitus without complications: Secondary | ICD-10-CM | POA: Diagnosis not present

## 2020-03-13 DIAGNOSIS — I10 Essential (primary) hypertension: Secondary | ICD-10-CM | POA: Diagnosis not present

## 2020-03-13 DIAGNOSIS — Z6832 Body mass index (BMI) 32.0-32.9, adult: Secondary | ICD-10-CM | POA: Diagnosis not present

## 2020-04-09 ENCOUNTER — Other Ambulatory Visit: Payer: Self-pay

## 2020-04-09 ENCOUNTER — Encounter: Payer: Self-pay | Admitting: Endocrinology

## 2020-04-09 ENCOUNTER — Ambulatory Visit (INDEPENDENT_AMBULATORY_CARE_PROVIDER_SITE_OTHER): Payer: Medicare Other | Admitting: Endocrinology

## 2020-04-09 VITALS — BP 132/80 | HR 80 | Ht 69.0 in | Wt 195.0 lb

## 2020-04-09 DIAGNOSIS — E119 Type 2 diabetes mellitus without complications: Secondary | ICD-10-CM

## 2020-04-09 DIAGNOSIS — Z794 Long term (current) use of insulin: Secondary | ICD-10-CM

## 2020-04-09 LAB — POCT GLYCOSYLATED HEMOGLOBIN (HGB A1C): Hemoglobin A1C: 7 % — AB (ref 4.0–5.6)

## 2020-04-09 NOTE — Patient Instructions (Addendum)
check your blood sugar twice a day.  vary the time of day when you check, between before the 3 meals, and at bedtime.  also check if you have symptoms of your blood sugar being too high or too low.  please keep a record of the readings and bring it to your next appointment here (or you can bring the meter itself).  You can write it on any piece of paper.  please call us sooner if your blood sugar goes below 70, or if most readings are over 200.  Please fax [place] cbg record to [phone].  Based on that, I'll reduce the insulin. On this type of insulin schedule, you should eat meals on a regular schedule.  If a meal is missed or significantly delayed, your blood sugar could go low.   Please come back for a follow-up appointment in 3 months.      Addendum on 04/10/20: cbg record is received and reviewed:  cbg varies from 84-205.  It is in general higher as the day goes on, but not necessarily so.   Reduce insulin to 24 units with breakfast, and 6 units with supper.

## 2020-04-09 NOTE — Progress Notes (Signed)
Subjective:    Patient ID: Gregory Kennedy, male    DOB: 07-05-1975, 44 y.o.   MRN: [id number]  HPI Pt returns for f/u of diabetes mellitus: DM type: Insulin-requiring type 2 Dx'ed: 1916 Complications: none Therapy: insulin since 2012, and 3 oral meds.   DKA: never Severe hypoglycemia: last episode was 2013 Pancreatitis: never Pancreatic imaging: never.  SDOH: he takes BID insulin, due to low functional status; he lives in a group home ([place]), where insulin is brought and administered to him; staff also checks cbg's.   Interval history: pt is here with caretaker.  no cbg record, but states cbg varies from 49-150.  There is no trend throughout the day.  pt states he feels well in general.   Past Medical History:  Diagnosis Date  . Diabetes mellitus   . Down syndrome   . Hypertension     No past surgical history on file.  Social History   Socioeconomic History  . Marital status: Single    Spouse name: Not on file  . Number of children: Not on file  . Years of education: Not on file  . Highest education level: Not on file  Occupational History  . Not on file  Tobacco Use  . Smoking status: Never Smoker  . Smokeless tobacco: Never Used  Substance and Sexual Activity  . Alcohol use: No  . Drug use: No  . Sexual activity: Not on file  Other Topics Concern  . Not on file  Social History Narrative  . Not on file   Social Determinants of Health   Financial Resource Strain:   . Difficulty of Paying Living Expenses: Not on file  Food Insecurity:   . Worried About Charity fundraiser in the Last Year: Not on file  . Ran Out of Food in the Last Year: Not on file  Transportation Needs:   . Lack of Transportation (Medical): Not on file  . Lack of Transportation (Non-Medical): Not on file  Physical Activity:   . Days of Exercise per Week: Not on file  . Minutes of Exercise per Session: Not on file  Stress:   . Feeling of Stress : Not on file  Social  Connections:   . Frequency of Communication with Friends and Family: Not on file  . Frequency of Social Gatherings with Friends and Family: Not on file  . Attends Religious Services: Not on file  . Active Member of Clubs or Organizations: Not on file  . Attends Archivist Meetings: Not on file  . Marital Status: Not on file  Intimate Partner Violence:   . Fear of Current or Ex-Partner: Not on file  . Emotionally Abused: Not on file  . Physically Abused: Not on file  . Sexually Abused: Not on file    Current Outpatient Medications on File Prior to Visit  Medication Sig Dispense Refill  . amLODipine (NORVASC) 10 MG tablet Take 10 mg by mouth daily.    Marland Kitchen atorvastatin (LIPITOR) 20 MG tablet Take 20 mg by mouth daily.    . dapagliflozin propanediol (FARXIGA) 5 MG TABS tablet Take 1 tablet (5 mg total) by mouth daily before breakfast. 90 tablet 3  . docusate sodium (COLACE) 100 MG capsule Take 100 mg by mouth 2 (two) times daily.    . enalapril (VASOTEC) 20 MG tablet Take 20 mg by mouth daily.    . hydrochlorothiazide (HYDRODIURIL) 25 MG tablet Take 25 mg by mouth daily.    Marland Kitchen  metFORMIN (GLUCOPHAGE) 500 MG tablet Take 500 mg by mouth 2 (two) times daily with a meal.    . naltrexone (DEPADE) 50 MG tablet Take 50 mg by mouth daily.    . Semaglutide (RYBELSUS) 14 MG TABS Take 14 mg by mouth every morning. 90 tablet 3  . TRUE METRIX BLOOD GLUCOSE TEST test strip USE TO TEST 3 TIMES A DAY. DX E10.9  6  . Vitamin D, Ergocalciferol, (DRISDOL) 50000 UNITS CAPS Take 50,000 Units by mouth every 30 (thirty) days. Take on the 14th     No current facility-administered medications on file prior to visit.    No Known Allergies  Family History  Problem Relation Age of Onset  . Diabetes Sister     BP 132/80   Pulse 80   Ht 5\' 9"  (1.753 m)   Wt 195 lb (88.5 kg)   SpO2 96%   BMI 28.80 kg/m    Review of Systems Denies LOC    Objective:   Physical Exam VITAL SIGNS:  See vs  page GENERAL: no distress Pulses: dorsalis pedis intact bilat.   MSK: no deformity of the feet CV: no leg edema Skin:  no ulcer on the feet.  normal color and temp on the feet.  Neuro: sensation is intact to touch on the feet  Lab Results  Component Value Date   HGBA1C 7.0 (A) 04/09/2020       Assessment & Plan:  Insulin-requiring type 2 DM Hypoglycemia, due to insulin: this limits aggressiveness of glycemic control   Patient Instructions  check your blood sugar twice a day.  vary the time of day when you check, between before the 3 meals, and at bedtime.  also check if you have symptoms of your blood sugar being too high or too low.  please keep a record of the readings and bring it to your next appointment here (or you can bring the meter itself).  You can write it on any piece of paper.  please call us sooner if your blood sugar goes below 70, or if most readings are over 200.  Please fax [place] cbg record to [phone].  Based on that, I'll reduce the insulin. On this type of insulin schedule, you should eat meals on a regular schedule.  If a meal is missed or significantly delayed, your blood sugar could go low.   Please come back for a follow-up appointment in 3 months.      Addendum on 04/10/20: cbg record is received and reviewed:  cbg varies from 84-205.  It is in general higher as the day goes on, but not necessarily so.   Reduce insulin to 24 units with breakfast, and 6 units with supper.

## 2020-04-10 ENCOUNTER — Telehealth: Payer: Self-pay | Admitting: Endocrinology

## 2020-04-10 MED ORDER — NOVOLOG MIX 70/30 FLEXPEN (70-30) 100 UNIT/ML ~~LOC~~ SUPN: PEN_INJECTOR | SUBCUTANEOUS | 11 refills | Status: DC

## 2020-04-10 NOTE — Telephone Encounter (Addendum)
please contact patient's facility: Please verify insulin is 24 units with breakfast, and 10 with supper.  Then please reduce supper insulin to 6 units. Please come back for a follow-up appointment in 3 months.

## 2020-04-10 NOTE — Telephone Encounter (Signed)
Unable to find call number for facility- did find fax number- faxed over information, and change to meds if insulin is verified.  Will await for confirmation from fax.

## 2020-04-11 NOTE — Telephone Encounter (Signed)
Received confirmation- placed in faxed file.

## 2020-04-24 DIAGNOSIS — Z6831 Body mass index (BMI) 31.0-31.9, adult: Secondary | ICD-10-CM | POA: Diagnosis not present

## 2020-04-24 DIAGNOSIS — E559 Vitamin D deficiency, unspecified: Secondary | ICD-10-CM | POA: Diagnosis not present

## 2020-04-24 DIAGNOSIS — Q909 Down syndrome, unspecified: Secondary | ICD-10-CM | POA: Diagnosis not present

## 2020-04-24 DIAGNOSIS — E119 Type 2 diabetes mellitus without complications: Secondary | ICD-10-CM | POA: Diagnosis not present

## 2020-07-09 ENCOUNTER — Other Ambulatory Visit: Payer: Self-pay

## 2020-07-11 ENCOUNTER — Other Ambulatory Visit: Payer: Self-pay

## 2020-07-11 ENCOUNTER — Ambulatory Visit (INDEPENDENT_AMBULATORY_CARE_PROVIDER_SITE_OTHER): Payer: Medicare Other | Admitting: Endocrinology

## 2020-07-11 VITALS — BP 142/84 | HR 80 | Ht 65.0 in | Wt 194.2 lb

## 2020-07-11 DIAGNOSIS — Z794 Long term (current) use of insulin: Secondary | ICD-10-CM | POA: Diagnosis not present

## 2020-07-11 DIAGNOSIS — E119 Type 2 diabetes mellitus without complications: Secondary | ICD-10-CM | POA: Diagnosis not present

## 2020-07-11 LAB — POCT GLYCOSYLATED HEMOGLOBIN (HGB A1C): Hemoglobin A1C: 6.6 % — AB (ref 4.0–5.6)

## 2020-07-11 MED ORDER — NOVOLOG MIX 70/30 FLEXPEN (70-30) 100 UNIT/ML ~~LOC~~ SUPN: PEN_INJECTOR | SUBCUTANEOUS | 11 refills | Status: DC

## 2020-07-11 NOTE — Progress Notes (Signed)
Subjective:    Patient ID: Gregory Kennedy, male    DOB: [birth year], 45 y.o.   MRN: [id number]  HPI Pt returns for f/u of diabetes mellitus: DM type: Insulin-requiring type 2 Dx'ed: 4098 Complications: none Therapy: insulin since 2012, and 3 oral meds.   DKA: never Severe hypoglycemia: last episode was 2013 Pancreatitis: never Pancreatic imaging: never.  SDOH: he takes BID insulin, due to low functional status; he lives in a group home ([facility]), where insulin is brought and administered to him; staff also checks cbg's.   Interval history: pt is here with caretaker.  no cbg record, but states cbg varies from 97-189.  There is no trend throughout the day.  pt states he feels well in general.  Past Medical History:  Diagnosis Date  . Diabetes mellitus   . Down syndrome   . Hypertension     No past surgical history on file.  Social History   Socioeconomic History  . Marital status: Single    Spouse name: Not on file  . Number of children: Not on file  . Years of education: Not on file  . Highest education level: Not on file  Occupational History  . Not on file  Tobacco Use  . Smoking status: Never Smoker  . Smokeless tobacco: Never Used  Substance and Sexual Activity  . Alcohol use: No  . Drug use: No  . Sexual activity: Not on file  Other Topics Concern  . Not on file  Social History Narrative  . Not on file   Social Determinants of Health   Financial Resource Strain: Not on file  Food Insecurity: Not on file  Transportation Needs: Not on file  Physical Activity: Not on file  Stress: Not on file  Social Connections: Not on file  Intimate Partner Violence: Not on file    Current Outpatient Medications on File Prior to Visit  Medication Sig Dispense Refill  . amLODipine (NORVASC) 10 MG tablet Take 10 mg by mouth daily.    Marland Kitchen atorvastatin (LIPITOR) 20 MG tablet Take 20 mg by mouth daily.    . dapagliflozin propanediol (FARXIGA) 5 MG TABS tablet Take 1  tablet (5 mg total) by mouth daily before breakfast. 90 tablet 3  . docusate sodium (COLACE) 100 MG capsule Take 100 mg by mouth 2 (two) times daily.    . enalapril (VASOTEC) 20 MG tablet Take 20 mg by mouth daily.    . hydrochlorothiazide (HYDRODIURIL) 25 MG tablet Take 25 mg by mouth daily.    . metFORMIN (GLUCOPHAGE) 500 MG tablet Take 500 mg by mouth 2 (two) times daily with a meal.    . naltrexone (DEPADE) 50 MG tablet Take 50 mg by mouth daily.    . Semaglutide (RYBELSUS) 14 MG TABS Take 14 mg by mouth every morning. 90 tablet 3  . TRUE METRIX BLOOD GLUCOSE TEST test strip USE TO TEST 3 TIMES A DAY. DX E10.9  6  . Vitamin D, Ergocalciferol, (DRISDOL) 50000 UNITS CAPS Take 50,000 Units by mouth every 30 (thirty) days. Take on the 14th     No current facility-administered medications on file prior to visit.    No Known Allergies  Family History  Problem Relation Age of Onset  . Diabetes Sister     BP (!) 142/84 (BP Location: Right Arm, Patient Position: Sitting, Cuff Size: Normal)   Pulse 80   Ht 5\' 5"  (1.651 m)   Wt 194 lb 3.2 oz (88.1 kg)  SpO2 97%   BMI 32.32 kg/m    Review of Systems     Objective:   Physical Exam VITAL SIGNS:  See vs page GENERAL: no distress Pulses: dorsalis pedis intact bilat.   MSK: no deformity of the feet CV: no leg edema Skin:  no ulcer on the feet.  normal color and temp on the feet. Neuro: sensation is intact to touch on the feet  Lab Results  Component Value Date   HGBA1C 6.6 (A) 07/11/2020       Assessment & Plan:  HTN: is noted today Insulin-requiring type 2 DM: overcontrolled.   Patient Instructions  Your blood pressure is high today.  Please see your primary care provider soon, to have it rechecked check your blood sugar twice a day.  vary the time of day when you check, between before the 3 meals, and at bedtime.  also check if you have symptoms of your blood sugar being too high or too low.  please keep a record of the  readings and bring it to your next appointment here (or you can bring the meter itself).  You can write it on any piece of paper.  please call us sooner if your blood sugar goes below 70, or if most readings are over 200.  please reduce the insulin to 24 units with breakfast, and 4 units with supper.  Please continue the same other diabetes medications On this type of insulin schedule, you should eat meals on a regular schedule.  If a meal is missed or significantly delayed, your blood sugar could go low.   Please come back for a follow-up appointment in 4 months.

## 2020-07-11 NOTE — Patient Instructions (Addendum)
Your blood pressure is high today.  Please see your primary care provider soon, to have it rechecked check your blood sugar twice a day.  vary the time of day when you check, between before the 3 meals, and at bedtime.  also check if you have symptoms of your blood sugar being too high or too low.  please keep a record of the readings and bring it to your next appointment here (or you can bring the meter itself).  You can write it on any piece of paper.  please call us sooner if your blood sugar goes below 70, or if most readings are over 200.  please reduce the insulin to 24 units with breakfast, and 4 units with supper.  Please continue the same other diabetes medications On this type of insulin schedule, you should eat meals on a regular schedule.  If a meal is missed or significantly delayed, your blood sugar could go low.   Please come back for a follow-up appointment in 4 months.

## 2020-07-18 ENCOUNTER — Telehealth: Payer: Self-pay

## 2020-07-18 NOTE — Telephone Encounter (Signed)
Fax came in from [facility] requesting OV note from 07/11/3020. External fax with OV notes faxed to [phone]

## 2020-11-13 ENCOUNTER — Ambulatory Visit (INDEPENDENT_AMBULATORY_CARE_PROVIDER_SITE_OTHER): Payer: Medicare Other | Admitting: Endocrinology

## 2020-11-13 ENCOUNTER — Other Ambulatory Visit: Payer: Self-pay

## 2020-11-13 VITALS — BP 110/80 | HR 87 | Ht 65.0 in | Wt 186.4 lb

## 2020-11-13 DIAGNOSIS — E119 Type 2 diabetes mellitus without complications: Secondary | ICD-10-CM

## 2020-11-13 DIAGNOSIS — Z794 Long term (current) use of insulin: Secondary | ICD-10-CM | POA: Diagnosis not present

## 2020-11-13 LAB — POCT GLYCOSYLATED HEMOGLOBIN (HGB A1C): Hemoglobin A1C: 6.6 % — AB (ref 4.0–5.6)

## 2020-11-13 MED ORDER — NOVOLOG MIX 70/30 FLEXPEN (70-30) 100 UNIT/ML ~~LOC~~ SUPN: 24.0000 [IU] | PEN_INJECTOR | Freq: Every day | SUBCUTANEOUS | 3 refills | Status: DC

## 2020-11-13 NOTE — Progress Notes (Signed)
Subjective:    Patient ID: Gregory Kennedy, male    DOB: October 16, 1975, 45 y.o.   MRN: [id number]  HPI Pt returns for f/u of diabetes mellitus: DM type: Insulin-requiring type 2 Dx'ed: 7517 Complications: none Therapy: insulin since 2012, and 3 oral meds.   DKA: never Severe hypoglycemia: last episode was 2013 Pancreatitis: never Pancreatic imaging: never.  SDOH: he takes BID insulin, due to low functional status; he lives in a group home ([place]), where insulin is brought and administered to him; staff also checks cbg's.   Interval history: pt is here with caretaker.  no cbg record, but states cbg varies from 91-206.  There is no trend throughout the day.  pt states he feels well in general.  Past Medical History:  Diagnosis Date   Diabetes mellitus    Down syndrome    Hypertension     No past surgical history on file.  Social History   Socioeconomic History   Marital status: Single    Spouse name: Not on file   Number of children: Not on file   Years of education: Not on file   Highest education level: Not on file  Occupational History   Not on file  Tobacco Use   Smoking status: Never   Smokeless tobacco: Never  Substance and Sexual Activity   Alcohol use: No   Drug use: No   Sexual activity: Not on file  Other Topics Concern   Not on file  Social History Narrative   Not on file   Social Determinants of Health   Financial Resource Strain: Not on file  Food Insecurity: Not on file  Transportation Needs: Not on file  Physical Activity: Not on file  Stress: Not on file  Social Connections: Not on file  Intimate Partner Violence: Not on file    Current Outpatient Medications on File Prior to Visit  Medication Sig Dispense Refill   amLODipine (NORVASC) 10 MG tablet Take 10 mg by mouth daily.     atorvastatin (LIPITOR) 20 MG tablet Take 20 mg by mouth daily.     dapagliflozin propanediol (FARXIGA) 5 MG TABS tablet Take 1 tablet (5 mg total) by mouth  daily before breakfast. 90 tablet 3   docusate sodium (COLACE) 100 MG capsule Take 100 mg by mouth 2 (two) times daily.     enalapril (VASOTEC) 20 MG tablet Take 20 mg by mouth daily.     hydrochlorothiazide (HYDRODIURIL) 25 MG tablet Take 25 mg by mouth daily.     metFORMIN (GLUCOPHAGE) 500 MG tablet Take 500 mg by mouth 2 (two) times daily with a meal.     naltrexone (DEPADE) 50 MG tablet Take 50 mg by mouth daily.     Semaglutide (RYBELSUS) 14 MG TABS Take 14 mg by mouth every morning. 90 tablet 3   TRUE METRIX BLOOD GLUCOSE TEST test strip USE TO TEST 3 TIMES A DAY. DX E10.9  6   Vitamin D, Ergocalciferol, (DRISDOL) 50000 UNITS CAPS Take 50,000 Units by mouth every 30 (thirty) days. Take on the 14th     No current facility-administered medications on file prior to visit.    No Known Allergies  Family History  Problem Relation Age of Onset   Diabetes Sister     BP 110/80 (BP Location: Right Arm, Patient Position: Sitting, Cuff Size: Normal)   Pulse 87   Ht 5\' 5"  (1.651 m)   Wt 186 lb 6.4 oz (84.6 kg)   SpO2 96%  BMI 31.02 kg/m    Review of Systems     Objective:   Physical Exam Pulses: dorsalis pedis intact bilat.   MSK: no deformity of the feet CV: no leg edema.  Skin:  no ulcer on the feet, but the skin is thickened.  normal color and temp on the feet.   Neuro: sensation is intact to touch on the feet.    Lab Results  Component Value Date   HGBA1C 6.6 (A) 11/13/2020        Assessment & Plan:  Insulin-requiring type 2 DM:  overcontrolled.  We discussed.  Pt says rather than reducing both doses, he wants to d/c PM insulin  Patient Instructions  check your blood sugar twice a day.  vary the time of day when you check, between before the 3 meals, and at bedtime.  also check if you have symptoms of your blood sugar being too high or too low.  please keep a record of the readings and bring it to your next appointment here (or you can bring the meter itself).  You  can write it on any piece of paper.  please call us sooner if your blood sugar goes below 70, or if most readings are over 200.  please reduce the insulin to 24 units with breakfast, and none with supper.   Please continue the same other diabetes medications On this type of insulin schedule, you should eat meals on a regular schedule.  If a meal is missed or significantly delayed, your blood sugar could go low.   Please come back for a follow-up appointment in 3 months.

## 2020-11-13 NOTE — Patient Instructions (Addendum)
check your blood sugar twice a day.  vary the time of day when you check, between before the 3 meals, and at bedtime.  also check if you have symptoms of your blood sugar being too high or too low.  please keep a record of the readings and bring it to your next appointment here (or you can bring the meter itself).  You can write it on any piece of paper.  please call us sooner if your blood sugar goes below 70, or if most readings are over 200.  please reduce the insulin to 24 units with breakfast, and none with supper.   Please continue the same other diabetes medications On this type of insulin schedule, you should eat meals on a regular schedule.  If a meal is missed or significantly delayed, your blood sugar could go low.   Please come back for a follow-up appointment in 3 months.

## 2021-02-17 ENCOUNTER — Ambulatory Visit: Payer: Medicare Other | Admitting: Endocrinology

## 2022-04-21 ENCOUNTER — Ambulatory Visit (INDEPENDENT_AMBULATORY_CARE_PROVIDER_SITE_OTHER): Payer: Medicare Other | Admitting: Internal Medicine

## 2022-04-21 ENCOUNTER — Encounter: Payer: Self-pay | Admitting: Internal Medicine

## 2022-04-21 VITALS — BP 130/70 | HR 72 | Ht 65.0 in | Wt 196.2 lb

## 2022-04-21 DIAGNOSIS — Z794 Long term (current) use of insulin: Secondary | ICD-10-CM | POA: Diagnosis not present

## 2022-04-21 DIAGNOSIS — E1165 Type 2 diabetes mellitus with hyperglycemia: Secondary | ICD-10-CM | POA: Diagnosis not present

## 2022-04-21 LAB — POCT GLUCOSE (DEVICE FOR HOME USE): POC Glucose: 148 mg/dl — AB (ref 70–99)

## 2022-04-21 MED ORDER — RYBELSUS 14 MG PO TABS: 14.0000 mg | ORAL_TABLET | ORAL | 3 refills | Status: AC

## 2022-04-21 MED ORDER — NOVOLOG MIX 70/30 FLEXPEN (70-30) 100 UNIT/ML ~~LOC~~ SUPN: 24.0000 [IU] | PEN_INJECTOR | Freq: Every day | SUBCUTANEOUS | 3 refills | Status: AC

## 2022-04-21 MED ORDER — DAPAGLIFLOZIN PROPANEDIOL 10 MG PO TABS: 10.0000 mg | ORAL_TABLET | Freq: Every day | ORAL | 3 refills | Status: AC

## 2022-04-21 MED ORDER — METFORMIN HCL 500 MG PO TABS: 500.0000 mg | ORAL_TABLET | Freq: Two times a day (BID) | ORAL | 3 refills | Status: DC

## 2022-04-21 MED ORDER — NOVOLOG MIX 70/30 FLEXPEN (70-30) 100 UNIT/ML ~~LOC~~ SUPN: 24.0000 [IU] | PEN_INJECTOR | Freq: Every day | SUBCUTANEOUS | 3 refills | Status: DC

## 2022-04-21 MED ORDER — RYBELSUS 14 MG PO TABS: 14.0000 mg | ORAL_TABLET | ORAL | 3 refills | Status: DC

## 2022-04-21 NOTE — Patient Instructions (Signed)
Continue Metformin 500 mg twice daily  Increase Farxiga 10 mg daily  Continue Rybelsus 14 mg daily  Continue Novolog Mix  24 units with breakfast     HOW TO TREAT LOW BLOOD SUGARS (Blood sugar LESS THAN 70 MG/DL) Please follow the RULE OF 15 for the treatment of hypoglycemia treatment (when your (blood sugars are less than 70 mg/dL)   STEP 1: Take 15 grams of carbohydrates when your blood sugar is low, which includes:  3-4 GLUCOSE TABS  OR 3-4 OZ OF JUICE OR REGULAR SODA OR ONE TUBE OF GLUCOSE GEL    STEP 2: RECHECK blood sugar in 15 MINUTES STEP 3: If your blood sugar is still low at the 15 minute recheck --> then, go back to STEP 1 and treat AGAIN with another 15 grams of carbohydrates.

## 2022-04-21 NOTE — Progress Notes (Signed)
Name: Gregory Kennedy  Age/ Sex: 46 y.o., male   MRN/ DOB: [id number], 09-04-1975     PCP: Trinidad Curet   Reason for Endocrinology Evaluation: Type 2 Diabetes Mellitus  Initial Endocrine Consultative Visit: 08/17/2017    PATIENT IDENTIFIER: Gregory Kennedy is a 46 y.o. male with a past medical history of DM, HTN and dyslipidemia . The patient has followed with Endocrinology clinic since 08/17/2017 for consultative assistance with management of his diabetes.  DIABETIC HISTORY:  Gregory Kennedy was diagnosed with DM 2011, and started insulin therapy  in 2013. His hemoglobin A1c has ranged from 6.6% in 2022, peaking at 9.8% in 2021.   Due to low functional status patient lives in a group home   The patient was followed by Dr. Loanne Drilling from 2019 until July 2022  SUBJECTIVE:   During the last visit (11/13/2020): Saw Dr. Loanne Drilling  Today (04/21/2022): Gregory Kennedy is here for  diabetes management. He is accompanied by a caregiver from the group home [facility]). He  gets his blood sugars  checked  1 times daily at the facility.No records today . Unknown to have hypoglycemia.  He avoids sugar sweetened beverages   Per caregiver , the pt gets 2 hours a day of unsupervised community visit and 6 hrs on Saturdays and during these times, he has been noted with hyperglycemia   Denies nausea or diarrhea   HOME DIABETES REGIMEN:  Metformin 500 mg BID Farxiga 5 mg daily  Rybelsus 14 mg daily  Novolog Mix  24 units with breakfast      Statin: yes ACE-I/ARB: yes    METER DOWNLOAD SUMMARY: no data     DIABETIC COMPLICATIONS: Microvascular complications:   Denies: CKD, neuropathy, retinopathy Last Eye Exam:  does not recall   Macrovascular complications:   Denies: CAD, CVA, PVD   HISTORY:  Past Medical History:  Past Medical History:  Diagnosis Date   Diabetes mellitus    Down syndrome    Hypertension    Past Surgical History: No past surgical history on file. Social History:   reports that he has never smoked. He has never used smokeless tobacco. He reports that he does not drink alcohol and does not use drugs. Family History:  Family History  Problem Relation Age of Onset   Diabetes Sister      HOME MEDICATIONS: Allergies as of 04/21/2022   No Known Allergies      Medication List        Accurate as of April 21, 2022  3:21 PM. If you have any questions, ask your nurse or doctor.          STOP taking these medications    dapagliflozin propanediol 5 MG Tabs tablet Commonly known as: [place] Stopped by: Dorita Sciara, MD       TAKE these medications    amLODipine 10 MG tablet Commonly known as: NORVASC Take 10 mg by mouth daily.   atorvastatin 20 MG tablet Commonly known as: LIPITOR Take 20 mg by mouth daily.   docusate sodium 100 MG capsule Commonly known as: COLACE Take 100 mg by mouth 2 (two) times daily.   enalapril 20 MG tablet Commonly known as: VASOTEC Take 20 mg by mouth daily.   hydrochlorothiazide 25 MG tablet Commonly known as: HYDRODIURIL Take 25 mg by mouth daily.   metFORMIN 500 MG tablet Commonly known as: GLUCOPHAGE Take 1 tablet (500 mg total) by mouth 2 (two) times daily with a meal.  naltrexone 50 MG tablet Commonly known as: DEPADE Take 50 mg by mouth daily.   NovoLOG Mix 70/30 FlexPen (70-30) 100 UNIT/ML FlexPen Generic drug: insulin aspart protamine - aspart Inject 24 Units into the skin daily with breakfast.   Rybelsus 14 MG Tabs Generic drug: Semaglutide Take 1 tablet (14 mg total) by mouth every morning.   True Metrix Blood Glucose Test test strip Generic drug: glucose blood USE TO TEST 3 TIMES A DAY. DX E10.9   Vitamin D (Ergocalciferol) 1.25 MG (50000 UNIT) Caps capsule Commonly known as: DRISDOL Take 50,000 Units by mouth every 30 (thirty) days. Take on the 14th         OBJECTIVE:   Vital Signs: BP 130/70 (BP Location: Left Arm, Patient Position: Sitting, Cuff Size:  Large)   Pulse 72   Ht '5\' 5"'$  (1.651 m)   Wt 196 lb 3.2 oz (89 kg)   SpO2 97%   BMI 32.65 kg/m   Wt Readings from Last 3 Encounters:  04/21/22 196 lb 3.2 oz (89 kg)  11/13/20 186 lb 6.4 oz (84.6 kg)  07/11/20 194 lb 3.2 oz (88.1 kg)     Exam: General: Pt appears well and is in NAD  Neck: General: Supple without adenopathy. Thyroid: Thyroid size normal.  No goiter or nodules appreciated.   Lungs: Clear with good BS bilat   Heart: RRR   Extremities: No pretibial edema.   Neuro: MS is good with appropriate affect, pt is alert and Ox3         DATA REVIEWED:  Lab Results  Component Value Date   HGBA1C 6.6 (A) 11/13/2020   HGBA1C 6.6 (A) 07/11/2020   HGBA1C 7.0 (A) 04/09/2020   03/25/2022 A1c 7.8%  LDL 69 HDL 56 Tg 65 BUN 13 Cr 1.01 GFR 93 TSH 0.525   Old records , labs and images have been reviewed.   ASSESSMENT / PLAN / RECOMMENDATIONS:   1) Type 2 Diabetes Mellitus, Sub-Optimally controlled, With out complications - Most recent A1c of 7.8 %. Goal A1c < 7.0 %.    -A1c has trended up -Patient lives in an independent group home, where they check his glucose daily, this data was not available today -In office BG 148 mg/DL  MEDICATIONS: Increase Farxiga 10 mg daily Continue metformin 500 mg twice daily Continue Rybelsus 14 mg daily Continue NovoLog mix 24 units daily  EDUCATION / INSTRUCTIONS: BG monitoring instructions: Patient is instructed to check his blood sugars 1 times a day, fasting . Call [facility] if: BG persistently < 70  I reviewed the Rule of 15 for the treatment of hypoglycemia in detail with the patient. Literature supplied.    2) Diabetic complications:  Eye: Does not have known diabetic retinopathy.  Neuro/ Feet: Does not have known diabetic peripheral neuropathy .  Renal: Patient does not have known baseline CKD. He   is  on an ACEI/ARB at present.      F/U in 6 months     Signed electronically by: Mack Guise, MD  [facility]  [facility] [address] [place], [place] [address] Phone: [phone] FAX: [phone]   CC: Trinidad Curet [address] [place] [address] Phone: [phone]  Fax: [phone]  Return to Endocrinology clinic as below: Future Appointments  Date Time Provider [place]  10/21/2022 11:10 AM Shaquana Buel, Melanie Crazier, MD [id number] None

## 2022-10-21 ENCOUNTER — Ambulatory Visit: Payer: Medicare Other | Admitting: Internal Medicine

## 2022-10-21 NOTE — Progress Notes (Deleted)
Name: Gregory Kennedy  Age/ Sex: 47 y.o., male   MRN/ DOB: [id number], May 06, 1976     PCP: Lucretia Field   Reason for Endocrinology Evaluation: Type 2 Diabetes Mellitus  Initial Endocrine Consultative Visit: 08/17/2017    PATIENT IDENTIFIER: Gregory Kennedy is a 47 y.o. male with a past medical history of DM, HTN and dyslipidemia . The patient has followed with Endocrinology clinic since 08/17/2017 for consultative assistance with management of his diabetes.  DIABETIC HISTORY:  Gregory Kennedy was diagnosed with DM 2011, and started insulin therapy  in 2013. His hemoglobin A1c has ranged from 6.6% in 2022, peaking at 9.8% in 2021.   Due to low functional status patient lives in a group home   The patient was followed by Dr. Everardo All from 2019 until July 2022   On his initial visit with me 04/2022, patient had an A1c of 7.8%, he was on Farxiga, metformin, Rybelsus, and NovoLog Mix, I increased his Marcelline Deist and continue the rest   SUBJECTIVE:   During the last visit (04/21/2022): A1c 7.8%  Today (10/21/2022): Gregory Kennedy is here for  diabetes management. He is accompanied by a caregiver from the group home [facility]). He  gets his blood sugars  checked  1 times daily at the facility.No records today Unknown to have hypoglycemia.    He avoids sugar sweetened beverages   Per caregiver , the pt gets 2 hours a day of unsupervised community visit and 6 hrs on Saturdays and during these times, he has been noted with hyperglycemia   Denies nausea or diarrhea   HOME DIABETES REGIMEN:  Metformin 500 mg BID Farxiga 10 mg daily  Rybelsus 14 mg daily  Novolog Mix  24 units with breakfast      Statin: yes ACE-I/ARB: yes    METER DOWNLOAD SUMMARY: no data     DIABETIC COMPLICATIONS: Microvascular complications:   Denies: CKD, neuropathy, retinopathy Last Eye Exam:  does not recall   Macrovascular complications:   Denies: CAD, CVA, PVD   HISTORY:  Past Medical History:  Past  Medical History:  Diagnosis Date   Diabetes mellitus    Down syndrome    Hypertension    Past Surgical History: No past surgical history on file. Social History:  reports that he has never smoked. He has never used smokeless tobacco. He reports that he does not drink alcohol and does not use drugs. Family History:  Family History  Problem Relation Age of Onset   Diabetes Sister      HOME MEDICATIONS: Allergies as of 10/21/2022   No Known Allergies      Medication List        Accurate as of October 21, 2022  7:26 AM. If you have any questions, ask your nurse or doctor.          amLODipine 10 MG tablet Commonly known as: NORVASC Take 10 mg by mouth daily.   atorvastatin 20 MG tablet Commonly known as: LIPITOR Take 20 mg by mouth daily.   dapagliflozin propanediol 10 MG Tabs tablet Commonly known as: Farxiga Take 1 tablet (10 mg total) by mouth daily before breakfast.   docusate sodium 100 MG capsule Commonly known as: COLACE Take 100 mg by mouth 2 (two) times daily.   enalapril 20 MG tablet Commonly known as: VASOTEC Take 20 mg by mouth daily.   hydrochlorothiazide 25 MG tablet Commonly known as: HYDRODIURIL Take 25 mg by mouth daily.   metFORMIN 500 MG tablet  Commonly known as: GLUCOPHAGE Take 1 tablet (500 mg total) by mouth 2 (two) times daily with a meal.   naltrexone 50 MG tablet Commonly known as: DEPADE Take 50 mg by mouth daily.   NovoLOG Mix 70/30 FlexPen (70-30) 100 UNIT/ML FlexPen Generic drug: insulin aspart protamine - aspart Inject 24 Units into the skin daily with breakfast.   Rybelsus 14 MG Tabs Generic drug: Semaglutide Take 1 tablet (14 mg total) by mouth every morning.   True Metrix Blood Glucose Test test strip Generic drug: glucose blood USE TO TEST 3 TIMES A DAY. DX E10.9   Vitamin D (Ergocalciferol) 1.25 MG (50000 UNIT) Caps capsule Commonly known as: DRISDOL Take 50,000 Units by mouth every 30 (thirty) days. Take on the  14th         OBJECTIVE:   Vital Signs: There were no vitals taken for this visit.  Wt Readings from Last 3 Encounters:  04/21/22 196 lb 3.2 oz (89 kg)  11/13/20 186 lb 6.4 oz (84.6 kg)  07/11/20 194 lb 3.2 oz (88.1 kg)     Exam: General: Pt appears well and is in NAD  Neck: General: Supple without adenopathy. Thyroid: Thyroid size normal.  No goiter or nodules appreciated.   Lungs: Clear with good BS bilat   Heart: RRR   Extremities: No pretibial edema.   Neuro: MS is good with appropriate affect, pt is alert and Ox3         DATA REVIEWED:  Lab Results  Component Value Date   HGBA1C 6.6 (A) 11/13/2020   HGBA1C 6.6 (A) 07/11/2020   HGBA1C 7.0 (A) 04/09/2020   03/25/2022 A1c 7.8%  LDL 69 HDL 56 Tg 65 BUN 13 Cr 1.01 GFR 93 TSH 0.525   Old records , labs and images have been reviewed.   ASSESSMENT / PLAN / RECOMMENDATIONS:   1) Type 2 Diabetes Mellitus, Sub-Optimally controlled, With out complications - Most recent A1c of 7.8 %. Goal A1c < 7.0 %.    -A1c has trended up -Patient lives in an independent group home, where they check his glucose daily, this data was not available today -In office BG 148 mg/DL  MEDICATIONS: Increase Farxiga 10 mg daily Continue metformin 500 mg twice daily Continue Rybelsus 14 mg daily Continue NovoLog mix 24 units daily  EDUCATION / INSTRUCTIONS: BG monitoring instructions: Patient is instructed to check his blood sugars 1 times a day, fasting . Call [facility] if: BG persistently < 70  I reviewed the Rule of 15 for the treatment of hypoglycemia in detail with the patient. Literature supplied.    2) Diabetic complications:  Eye: Does not have known diabetic retinopathy.  Neuro/ Feet: Does not have known diabetic peripheral neuropathy .  Renal: Patient does not have known baseline CKD. He   is  on an ACEI/ARB at present.      F/U in 6 months     Signed electronically by: Lyndle Herrlich, MD  [facility] Endocrinology  [facility] [address] [place] [place], [place] [address] Phone: [phone] FAX: [phone]   CC: Lucretia Field [address] [place] [address] Phone: [phone]  Fax: [phone]  Return to Endocrinology clinic as below: Future Appointments  Date Time Provider Department Center  10/21/2022 11:10 AM Travis Purk, Konrad Dolores, MD [id number] None

## 2023-03-27 NOTE — Progress Notes (Signed)
Declined SDOH.

## 2023-04-05 NOTE — Progress Notes (Unsigned)
Name: Gregory Kennedy  Age/ Sex: 47 y.o., male   MRN/ DOB: [id number], [birth year]     PCP: Lucretia Field   Reason for Endocrinology Evaluation: Type 2 Diabetes Mellitus  Initial Endocrine Consultative Visit: 08/17/2017    PATIENT IDENTIFIER: Mr. Gregory Kennedy is a 47 y.o. male with a past medical history of DM, HTN and dyslipidemia . The patient has followed with Endocrinology clinic since 08/17/2017 for consultative assistance with management of his diabetes.  DIABETIC HISTORY:  Mr. Rietz was diagnosed with DM 2011, and started insulin therapy  in 2013. His hemoglobin A1c has ranged from 6.6% in 2022, peaking at 9.8% in 2021.   Due to low functional status patient lives in a group home   The patient was followed by Dr. Everardo All from 2019 until July 2022  SUBJECTIVE:   During the last visit (04/21/2022): A1c 7.8%  Today (04/06/2023): Gregory Kennedy is here for  diabetes management. He is accompanied by a caregiver from the group home Gregory Kennedy). He  gets his blood sugars  checked  1times daily at the facility. Pt has been noted with hypoglycemia , the patient is symptomatic with these episodes    Per caregiver , the pt gets 2 hours a day of unsupervised community visit and 6 hrs on Saturdays and during these times, he has been noted with hyperglycemia   Denies nausea or vomiting  Denies constipation or diarrhea  Patient is complaining of right-sided abdominal wall pain, denies urinary frequency or dysuria   HOME DIABETES REGIMEN:  Metformin 500 mg BID Farxiga 10 mg daily  Rybelsus 14 mg daily  Novolog Mix  24 units with breakfast      Statin: yes ACE-I/ARB: yes    METER DOWNLOAD SUMMARY:  59- 207 mg/dL     DIABETIC COMPLICATIONS: Microvascular complications:   Denies: CKD, neuropathy, retinopathy Last Eye Exam:  does not recall   Macrovascular complications:   Denies: CAD, CVA, PVD   HISTORY:  Past Medical History:  Past Medical History:  Diagnosis Date    Diabetes mellitus    Down syndrome    Hypertension    Past Surgical History: No past surgical history on file. Social History:  reports that he has never smoked. He has never used smokeless tobacco. He reports that he does not drink alcohol and does not use drugs. Family History:  Family History  Problem Relation Age of Onset   Diabetes Sister      HOME MEDICATIONS: Allergies as of 04/06/2023   No Known Allergies      Medication List        Accurate as of April 06, 2023 11:31 AM. If you have any questions, ask your nurse or doctor.          amLODipine 10 MG tablet Commonly known as: NORVASC Take 10 mg by mouth daily.   atorvastatin 20 MG tablet Commonly known as: LIPITOR Take 20 mg by mouth daily.   BD AutoShield Duo 30G X 5 MM Misc Generic drug: Insulin Pen Needle   dapagliflozin propanediol 10 MG Tabs tablet Commonly known as: Farxiga Take 1 tablet (10 mg total) by mouth daily before breakfast.   docusate sodium 100 MG capsule Commonly known as: COLACE Take 100 mg by mouth 2 (two) times daily.   enalapril 20 MG tablet Commonly known as: VASOTEC Take 20 mg by mouth daily.   hydrochlorothiazide 25 MG tablet Commonly known as: HYDRODIURIL Take 25 mg by mouth daily.   hydrochlorothiazide 12.5  MG capsule Commonly known as: MICROZIDE Take 12.5 mg by mouth daily.   metFORMIN 500 MG tablet Commonly known as: GLUCOPHAGE Take 1 tablet (500 mg total) by mouth 2 (two) times daily with a meal.   naltrexone 50 MG tablet Commonly known as: DEPADE Take 50 mg by mouth daily.   NovoLOG Mix 70/30 FlexPen (70-30) 100 UNIT/ML FlexPen Generic drug: insulin aspart protamine - aspart Inject 24 Units into the skin daily with breakfast.   Rybelsus 14 MG Tabs Generic drug: Semaglutide Take 1 tablet (14 mg total) by mouth every morning.   True Metrix Blood Glucose Test test strip Generic drug: glucose blood USE TO TEST 3 TIMES A DAY. DX E10.9   Trulance 3  MG Tabs Generic drug: Plecanatide Take 1 tablet by mouth daily.   Vitamin D (Ergocalciferol) 1.25 MG (50000 UNIT) Caps capsule Commonly known as: DRISDOL Take 50,000 Units by mouth every 30 (thirty) days. Take on the 14th         OBJECTIVE:   Vital Signs: BP 126/80 (BP Location: Left Arm, Patient Position: Sitting, Cuff Size: Large)   Pulse 85   Ht 5\' 5"  (1.651 m)   Wt 190 lb (86.2 kg)   SpO2 97%   BMI 31.62 kg/m   Wt Readings from Last 3 Encounters:  04/06/23 190 lb (86.2 kg)  04/21/22 196 lb 3.2 oz (89 kg)  11/13/20 186 lb 6.4 oz (84.6 kg)     Exam: General: Pt appears well and is in NAD  Neck: General: Supple without adenopathy. Thyroid: Thyroid size normal.  No goiter or nodules appreciated.   Lungs: Clear with good BS bilat   Heart: RRR   Extremities: No pretibial edema.   Neuro: MS is good with appropriate affect, pt is alert and Ox3         DATA REVIEWED:  Lab Results  Component Value Date   HGBA1C 6.8 (A) 04/06/2023   HGBA1C 6.6 (A) 11/13/2020   HGBA1C 6.6 (A) 07/11/2020   03/25/2022 A1c 7.8%  LDL 69 HDL 56 Tg 65 BUN 13 Cr 1.01 GFR 93 TSH 0.525   Old records , labs and images have been reviewed.   ASSESSMENT / PLAN / RECOMMENDATIONS:   1) Type 2 Diabetes Mellitus,Optimally controlled, With out complications - Most recent A1c of 6.8 %. Goal A1c < 7.0 %.    -A1c is optimal -Patient lives in an independent group home, where they check his glucose daily, and reviewing glucose data, patient has been noted with hypoglycemia, will decrease insulin as below   MEDICATIONS: Continue Farxiga 10 mg daily Continue metformin 500 mg twice daily Continue Rybelsus 14 mg daily Decrease NovoLog mix 22 units daily  EDUCATION / INSTRUCTIONS: BG monitoring instructions: Patient is instructed to check his blood sugars 1 times a day, fasting . Call [place] [facility] if: BG persistently < 70  I reviewed the Rule of 15 for the treatment of  hypoglycemia in detail with the patient. Literature supplied.    2) Diabetic complications:  Eye: Does not have known diabetic retinopathy.  Neuro/ Feet: Does not have known diabetic peripheral neuropathy .  Renal: Patient does not have known baseline CKD. He   is  on an ACEI/ARB at present.   3) Musculoskeletal pain:  -Patient has pinpoint tenderness at the right lateral abdominal wall, mid axillary line, denies any falls or trauma, patient to use Tylenol as needed per facility protocol   F/U in 6 months     Signed  electronically by: Lyndle Herrlich, MD  [facility] Endocrinology  [facility] [address] [place], [place] [address] Phone: [phone] FAX: [phone]   CC: Lucretia Field [address] [place] [address] Phone: [phone]  Fax: [phone]  Return to Endocrinology clinic as below: No future appointments.

## 2023-04-06 ENCOUNTER — Encounter: Payer: Self-pay | Admitting: Internal Medicine

## 2023-04-06 ENCOUNTER — Ambulatory Visit: Payer: Medicare Other | Admitting: Internal Medicine

## 2023-04-06 VITALS — BP 126/80 | HR 85 | Ht 65.0 in | Wt 190.0 lb

## 2023-04-06 DIAGNOSIS — E1165 Type 2 diabetes mellitus with hyperglycemia: Secondary | ICD-10-CM | POA: Diagnosis not present

## 2023-04-06 DIAGNOSIS — Z794 Long term (current) use of insulin: Secondary | ICD-10-CM | POA: Diagnosis not present

## 2023-04-06 LAB — POCT GLYCOSYLATED HEMOGLOBIN (HGB A1C): Hemoglobin A1C: 6.8 % — AB (ref 4.0–5.6)

## 2023-04-06 NOTE — Patient Instructions (Signed)
Continue Metformin 500 mg twice daily  Continue Farxiga 10 mg daily  Continue Rybelsus 14 mg daily  Decrease  Novolog Mix  22 units with breakfast     HOW TO TREAT LOW BLOOD SUGARS (Blood sugar LESS THAN 70 MG/DL) Please follow the RULE OF 15 for the treatment of hypoglycemia treatment (when your (blood sugars are less than 70 mg/dL)   STEP 1: Take 15 grams of carbohydrates when your blood sugar is low, which includes:  3-4 GLUCOSE TABS  OR 3-4 OZ OF JUICE OR REGULAR SODA OR ONE TUBE OF GLUCOSE GEL    STEP 2: RECHECK blood sugar in 15 MINUTES STEP 3: If your blood sugar is still low at the 15 minute recheck --> then, go back to STEP 1 and treat AGAIN with another 15 grams of carbohydrates.

## 2023-06-08 NOTE — Progress Notes (Signed)
The patient attended 03/27/23 screening event where his screening results were wnl. At the event the patient noted he didn't have a pcp or insurance. Patient declined SDOH questionnaire. Per chart review the pt does have a pcp and active insurance. The last office visit was 04/06/23 with Shamleffer, Konrad Dolores, MD referred by the pts pcp.  Chart review did not indicate any future appts.  No additional Health equity team support indicated at this time.

## 2023-11-19 ENCOUNTER — Ambulatory Visit (INDEPENDENT_AMBULATORY_CARE_PROVIDER_SITE_OTHER): Admitting: Internal Medicine

## 2023-11-19 ENCOUNTER — Encounter: Payer: Self-pay | Admitting: Internal Medicine

## 2023-11-19 VITALS — BP 122/80 | HR 70 | Ht 65.0 in | Wt 181.0 lb

## 2023-11-19 DIAGNOSIS — Z794 Long term (current) use of insulin: Secondary | ICD-10-CM | POA: Diagnosis not present

## 2023-11-19 DIAGNOSIS — E1165 Type 2 diabetes mellitus with hyperglycemia: Secondary | ICD-10-CM

## 2023-11-19 LAB — POCT GLYCOSYLATED HEMOGLOBIN (HGB A1C): Hemoglobin A1C: 7.2 % — AB (ref 4.0–5.6)

## 2023-11-19 LAB — POCT GLUCOSE (DEVICE FOR HOME USE): POC Glucose: 121 mg/dL — AB (ref 70–99)

## 2023-11-19 MED ORDER — METFORMIN HCL ER 750 MG PO TB24: 750.0000 mg | ORAL_TABLET | Freq: Every day | ORAL | 3 refills | Status: AC

## 2023-11-19 NOTE — Patient Instructions (Addendum)
 Change Metformin  750 mg  daily  Continue Farxiga  10 mg daily  Continue Rybelsus  14 mg daily  Continue Novolog  Mix  22 units with breakfast     HOW TO TREAT LOW BLOOD SUGARS (Blood sugar LESS THAN 70 MG/DL) Please follow the RULE OF 15 for the treatment of hypoglycemia treatment (when your (blood sugars are less than 70 mg/dL)   STEP 1: Take 15 grams of carbohydrates when your blood sugar is low, which includes:  3-4 GLUCOSE TABS  OR 3-4 OZ OF JUICE OR REGULAR SODA OR ONE TUBE OF GLUCOSE GEL    STEP 2: RECHECK blood sugar in 15 MINUTES STEP 3: If your blood sugar is still low at the 15 minute recheck --> then, go back to STEP 1 and treat AGAIN with another 15 grams of carbohydrates.

## 2023-11-19 NOTE — Progress Notes (Signed)
 Name: Gregory Kennedy  Age/ Sex: 48 y.o., male   MRN/ DOB: [id number], 24-Oct-1975     PCP: Gregory Kennedy   Reason for Endocrinology Evaluation: Type 2 Diabetes Mellitus  Initial Endocrine Consultative Visit: 08/17/2017    PATIENT IDENTIFIER: Gregory Kennedy is a 48 y.o. male with a past medical history of DM, HTN and dyslipidemia . The patient has followed with Endocrinology clinic since 08/17/2017 for consultative assistance with management of his diabetes.  DIABETIC HISTORY:  Gregory Kennedy was diagnosed with DM 2011, and started insulin  therapy  in 2013. His hemoglobin A1c has ranged from 6.6% in 2022, peaking at 9.8% in 2021.   Due to low functional status patient lives in a group home   The patient was followed by Dr. Kassie from 2019 until July 2022  SUBJECTIVE:   During the last visit (04/06/2023): A1c 6.8%  Today (11/19/2023): Gregory Kennedy is here for  diabetes management. He is accompanied by a caregiver from the group home Gregory Kennedy). He  gets his blood sugars  checked  1times daily at the facility.  No glucose data today  Per caregiver patient refuses to take metformin  twice daily   Per caregiver , the pt gets 2 hours a day of unsupervised community visit and 6 hrs on Saturdays and during these times, he has been noted with hyperglycemia    Denies nausea or vomiting  Denies constipation or diarrhea    HOME DIABETES REGIMEN:  Metformin  500 mg BID-takes it once daily Farxiga  10 mg daily  Rybelsus  14 mg daily  Novolog  Mix  22 units with breakfast      Statin: yes ACE-I/ARB: yes    METER DOWNLOAD SUMMARY: N/A   DIABETIC COMPLICATIONS: Microvascular complications:   Denies: CKD, neuropathy, retinopathy Last Eye Exam:  does not recall   Macrovascular complications:   Denies: CAD, CVA, PVD   HISTORY:  Past Medical History:  Past Medical History:  Diagnosis Date   Diabetes mellitus    Down syndrome    Hypertension    Past Surgical History: No past  surgical history on file. Social History:  reports that he has never smoked. He has never used smokeless tobacco. He reports that he does not drink alcohol and does not use drugs. Family History:  Family History  Problem Relation Age of Onset   Diabetes Sister      HOME MEDICATIONS: Allergies as of 11/19/2023   No Known Allergies      Medication List        Accurate as of November 19, 2023 10:24 AM. If you have any questions, ask your nurse or doctor.          acetaminophen 160 MG/5ML liquid Commonly known as: TYLENOL Take 20 mLs by mouth every 4 (four) hours as needed for fever.   amLODipine 10 MG tablet Commonly known as: NORVASC Take 10 mg by mouth daily.   atorvastatin 20 MG tablet Commonly known as: LIPITOR Take 20 mg by mouth daily.   BD AutoShield Duo 30G X 5 MM Misc Generic drug: Insulin  Pen Needle   dapagliflozin  propanediol 10 MG Tabs tablet Commonly known as: Farxiga  Take 1 tablet (10 mg total) by mouth daily before breakfast.   docusate sodium 100 MG capsule Commonly known as: COLACE Take 100 mg by mouth 2 (two) times daily.   enalapril 20 MG tablet Commonly known as: VASOTEC Take 20 mg by mouth daily.   hydrochlorothiazide 25 MG tablet Commonly known as: HYDRODIURIL Take  25 mg by mouth daily.   hydrochlorothiazide 12.5 MG capsule Commonly known as: MICROZIDE Take 12.5 mg by mouth daily.   LORazepam 2 MG tablet Commonly known as: ATIVAN Take 2 mg by mouth as needed for anxiety.   metFORMIN  500 MG tablet Commonly known as: GLUCOPHAGE  Take 1 tablet (500 mg total) by mouth 2 (two) times daily with a meal. What changed: when to take this   naltrexone 50 MG tablet Commonly known as: DEPADE Take 50 mg by mouth daily.   NovoLOG  Mix 70/30 FlexPen (70-30) 100 UNIT/ML FlexPen Generic drug: insulin  aspart protamine - aspart Inject 24 Units into the skin daily with breakfast. What changed: how much to take   Rybelsus  14 MG Tabs Generic drug:  Semaglutide  Take 1 tablet (14 mg total) by mouth every morning.   True Metrix Blood Glucose Test test strip Generic drug: glucose blood USE TO TEST 3 TIMES A DAY. DX E10.9   Trulance 3 MG Tabs Generic drug: Plecanatide Take 1 tablet by mouth daily.   Vitamin D (Ergocalciferol) 1.25 MG (50000 UNIT) Caps capsule Commonly known as: DRISDOL Take 50,000 Units by mouth every 30 (thirty) days. Take on the 14th         OBJECTIVE:   Vital Signs: BP 122/80 (BP Location: Left Arm, Patient Position: Sitting, Cuff Size: Normal)   Pulse 70   Ht 5' 5 (1.651 m)   Wt 181 lb (82.1 kg)   SpO2 98%   BMI 30.12 kg/m   Wt Readings from Last 3 Encounters:  11/19/23 181 lb (82.1 kg)  04/06/23 190 lb (86.2 kg)  04/21/22 196 lb 3.2 oz (89 kg)     Exam: General: Pt appears well and is in NAD  Neck: General: Supple without adenopathy. Thyroid : Thyroid  size normal.  No goiter or nodules appreciated.   Lungs: Clear with good BS bilat   Heart: RRR   Extremities: No pretibial edema.   Neuro: MS is good with appropriate affect, pt is alert and Ox3      DM Foot Exam 11/19/2023  The skin of the feet is intact without sores or ulcerations. The pedal pulses are 2+ on right and 2+ on left. The sensation is intact to a screening 5.07, 10 gram monofilament bilaterally    DATA REVIEWED:  Lab Results  Component Value Date   HGBA1C 6.8 (A) 04/06/2023   HGBA1C 6.6 (A) 11/13/2020   HGBA1C 6.6 (A) 07/11/2020   03/25/2022 A1c 7.8%  LDL 69 HDL 56 Tg 65 BUN 13 Cr 1.01 GFR 93 TSH 0.525   Old records , labs and images have been reviewed.   ASSESSMENT / PLAN / RECOMMENDATIONS:   1) Type 2 Diabetes Mellitus,Optimally controlled, With out complications - Most recent A1c of 6.8 %. Goal A1c < 7.0 %.    -A1c is slightly higher than goal -Patient lives in an independent group home, where they check his glucose daily, no glucose data available today - Patient refuses to take metformin  2 tablets  daily, I will change his metformin  from 500 mg to 750 Mg, that way he can take it 1 tablet a day  MEDICATIONS: Continue Farxiga  10 mg daily Change  Metformin  750 mg daily Continue Rybelsus  14 mg daily Continue  NovoLog  mix 22 units with breakfast  EDUCATION / INSTRUCTIONS: BG monitoring instructions: Patient is instructed to check his blood sugars 1 times a day, fasting . Call [place] [facility] if: BG persistently < 70  I reviewed the Rule of  15 for the treatment of hypoglycemia in detail with the patient. Literature supplied.    2) Diabetic complications:  Eye: Does not have known diabetic retinopathy.  Neuro/ Feet: Does not have known diabetic peripheral neuropathy .  Renal: Patient does not have known baseline CKD. He   is  on an ACEI/ARB at present.     F/U in 6 months     Signed electronically by: Stefano Redgie Butts, MD  [facility] Endocrinology  [facility] [address] [place] [place], [place] [address] Phone: [phone] FAX: [phone]   CC: Gregory Kennedy [address] [place] [address] Phone: [phone]  Fax: [phone]  Return to Endocrinology clinic as below: No future appointments.

## 2024-05-22 ENCOUNTER — Ambulatory Visit: Admitting: Internal Medicine

## 2024-05-22 NOTE — Progress Notes (Unsigned)
 "   Name: Gregory Kennedy  Age/ Sex: 49 y.o., male   MRN/ DOB: [id number], 1975-06-12     PCP: Hilma Philis HERO   Reason for Endocrinology Evaluation: Type 2 Diabetes Mellitus  Initial Endocrine Consultative Visit: 08/17/2017    PATIENT IDENTIFIER: Gregory Kennedy is a 49 y.o. male with a past medical history of DM, HTN and dyslipidemia . The patient has followed with Endocrinology clinic since 08/17/2017 for consultative assistance with management of his diabetes.  DIABETIC HISTORY:  Gregory Kennedy was diagnosed with DM 2011, and started insulin  therapy  in 2013. His hemoglobin A1c has ranged from 6.6% in 2022, peaking at 9.8% in 2021.   Due to low functional status patient lives in a group home   The patient was followed by Dr. Kassie from 2019 until July 2022  SUBJECTIVE:   During the last visit (11/19/2023): A1c 6.8%  Today (05/22/2024): Gregory Kennedy is here for  diabetes management. He is accompanied by a caregiver from the group home [facility]). He  gets his blood sugars  checked  1times daily at the facility.  No glucose data today  Per caregiver patient refuses to take metformin  twice daily   Per caregiver , the pt gets 2 hours a day of unsupervised community visit and 6 hrs on Saturdays and during these times, he has been noted with hyperglycemia    Denies nausea or vomiting  Denies constipation or diarrhea    HOME DIABETES REGIMEN:  Metformin  500 mg BID-takes it once daily Farxiga  10 mg daily  Rybelsus  14 mg daily  Novolog  Mix  22 units with breakfast      Statin: yes ACE-I/ARB: yes    METER DOWNLOAD SUMMARY: N/A   DIABETIC COMPLICATIONS: Microvascular complications:   Denies: CKD, neuropathy, retinopathy Last Eye Exam:  does not recall   Macrovascular complications:   Denies: CAD, CVA, PVD   HISTORY:  Past Medical History:  Past Medical History:  Diagnosis Date   Diabetes mellitus    Down syndrome    Hypertension    Past Surgical History: No past  surgical history on file. Social History:  reports that he has never smoked. He has never used smokeless tobacco. He reports that he does not drink alcohol and does not use drugs. Family History:  Family History  Problem Relation Age of Onset   Diabetes Sister      HOME MEDICATIONS: Allergies as of 05/22/2024   No Known Allergies      Medication List        Accurate as of May 22, 2024  7:23 AM. If you have any questions, ask your nurse or doctor.          acetaminophen 160 MG/5ML liquid Commonly known as: TYLENOL Take 20 mLs by mouth every 4 (four) hours as needed for fever.   amLODipine 10 MG tablet Commonly known as: NORVASC Take 10 mg by mouth daily.   atorvastatin 20 MG tablet Commonly known as: LIPITOR Take 20 mg by mouth daily.   BD AutoShield Duo 30G X 5 MM Misc Generic drug: Insulin  Pen Needle   dapagliflozin  propanediol 10 MG Tabs tablet Commonly known as: Farxiga  Take 1 tablet (10 mg total) by mouth daily before breakfast.   docusate sodium 100 MG capsule Commonly known as: COLACE Take 100 mg by mouth 2 (two) times daily.   enalapril 20 MG tablet Commonly known as: VASOTEC Take 20 mg by mouth daily.   hydrochlorothiazide 25 MG tablet Commonly  known as: HYDRODIURIL Take 25 mg by mouth daily.   hydrochlorothiazide 12.5 MG capsule Commonly known as: MICROZIDE Take 12.5 mg by mouth daily.   LORazepam 2 MG tablet Commonly known as: ATIVAN Take 2 mg by mouth as needed for anxiety.   metFORMIN  750 MG 24 hr tablet Commonly known as: GLUCOPHAGE -XR Take 1 tablet (750 mg total) by mouth daily with breakfast.   naltrexone 50 MG tablet Commonly known as: DEPADE Take 50 mg by mouth daily.   NovoLOG  Mix 70/30 FlexPen (70-30) 100 UNIT/ML FlexPen Generic drug: insulin  aspart protamine - aspart Inject 24 Units into the skin daily with breakfast. What changed: how much to take   Rybelsus  14 MG Tabs Generic drug: Semaglutide  Take 1 tablet (14 mg  total) by mouth every morning.   True Metrix Blood Glucose Test test strip Generic drug: glucose blood USE TO TEST 3 TIMES A DAY. DX E10.9   Trulance 3 MG Tabs Generic drug: Plecanatide Take 1 tablet by mouth daily.   Vitamin D (Ergocalciferol) 1.25 MG (50000 UNIT) Caps capsule Commonly known as: DRISDOL Take 50,000 Units by mouth every 30 (thirty) days. Take on the 14th         OBJECTIVE:   Vital Signs: There were no vitals taken for this visit.  Wt Readings from Last 3 Encounters:  11/19/23 181 lb (82.1 kg)  04/06/23 190 lb (86.2 kg)  04/21/22 196 lb 3.2 oz (89 kg)     Exam: General: Pt appears well and is in NAD  Neck: General: Supple without adenopathy. Thyroid : Thyroid  size normal.  No goiter or nodules appreciated.   Lungs: Clear with good BS bilat   Heart: RRR   Extremities: No pretibial edema.   Neuro: MS is good with appropriate affect, pt is alert and Ox3      DM Foot Exam 11/19/2023  The skin of the feet is intact without sores or ulcerations. The pedal pulses are 2+ on right and 2+ on left. The sensation is intact to a screening 5.07, 10 gram monofilament bilaterally    DATA REVIEWED:  Lab Results  Component Value Date   HGBA1C 7.2 (A) 11/19/2023   HGBA1C 6.8 (A) 04/06/2023   HGBA1C 6.6 (A) 11/13/2020   03/25/2022 A1c 7.8%  LDL 69 HDL 56 Tg 65 BUN 13 Cr 1.01 GFR 93 TSH 0.525   Old records , labs and images have been reviewed.   ASSESSMENT / PLAN / RECOMMENDATIONS:   1) Type 2 Diabetes Mellitus,Optimally controlled, With out complications - Most recent A1c of 6.8 %. Goal A1c < 7.0 %.    -A1c is slightly higher than goal -Patient lives in an independent group home, where they check his glucose daily, no glucose data available today - Patient refuses to take metformin  2 tablets daily, I will change his metformin  from 500 mg to 750 Mg, that way he can take it 1 tablet a day  MEDICATIONS: Continue Farxiga  10 mg daily Change   Metformin  750 mg daily Continue Rybelsus  14 mg daily Continue  NovoLog  mix 22 units with breakfast  EDUCATION / INSTRUCTIONS: BG monitoring instructions: Patient is instructed to check his blood sugars 1 times a day, fasting . Call [facility] if: BG persistently < 70  I reviewed the Rule of 15 for the treatment of hypoglycemia in detail with the patient. Literature supplied.    2) Diabetic complications:  Eye: Does not have known diabetic retinopathy.  Neuro/ Feet: Does not have known diabetic peripheral  neuropathy .  Renal: Patient does not have known baseline CKD. He   is  on an ACEI/ARB at present.     F/U in 6 months     Signed electronically by: Stefano Redgie Butts, MD  [facility] Endocrinology  [facility] [address] [place] [place], [place] [address] Phone: [phone] FAX: [phone]   CC: Hilma Philis HERO [address] [place] [address] Phone: [phone]  Fax: [phone]  Return to Endocrinology clinic as below: Future Appointments  Date Time Provider Department Center  05/22/2024 10:50 AM Omaria Plunk, Donell Redgie, MD [facility] None  06/29/2024  1:00 PM Alvia Corean CROME, FNP [facility]        "

## 2024-06-29 ENCOUNTER — Ambulatory Visit: Admitting: Family Medicine
